# Patient Record
Sex: Female | Born: 1980
Health system: Southern US, Community
[De-identification: ages and names within clinical notes are randomized; demographics above are authoritative.]

## PROBLEM LIST (undated history)

## (undated) DIAGNOSIS — R000 Tachycardia, unspecified: Secondary | ICD-10-CM

## (undated) DIAGNOSIS — D649 Anemia, unspecified: Secondary | ICD-10-CM

## (undated) DIAGNOSIS — M419 Scoliosis, unspecified: Secondary | ICD-10-CM

## (undated) HISTORY — PX: OTHER SURGICAL HISTORY: SHX169

## (undated) HISTORY — DX: Tachycardia, unspecified: R00.0

## (undated) HISTORY — PX: FRACTURE SURGERY: SHX138

## (undated) HISTORY — DX: Scoliosis, unspecified: M41.9

## (undated) HISTORY — DX: Anemia, unspecified: D64.9

---

## 2003-06-30 ENCOUNTER — Emergency Department (HOSPITAL_COMMUNITY): Admission: EM | Admit: 2003-06-30 | Discharge: 2003-07-01 | Payer: Self-pay | Admitting: Emergency Medicine

## 2012-08-18 ENCOUNTER — Ambulatory Visit (INDEPENDENT_AMBULATORY_CARE_PROVIDER_SITE_OTHER): Payer: 59 | Admitting: Internal Medicine

## 2012-08-18 VITALS — BP 132/70 | HR 92 | Temp 98.6°F | Resp 16 | Ht 69.5 in | Wt 148.2 lb

## 2012-08-18 DIAGNOSIS — Z0000 Encounter for general adult medical examination without abnormal findings: Secondary | ICD-10-CM

## 2012-08-18 DIAGNOSIS — Z309 Encounter for contraceptive management, unspecified: Secondary | ICD-10-CM

## 2012-08-18 MED ORDER — NORGESTIM-ETH ESTRAD TRIPHASIC 0.18/0.215/0.25 MG-35 MCG PO TABS: 1.0000 | ORAL_TABLET | Freq: Every day | ORAL | Status: DC

## 2012-08-18 NOTE — Progress Notes (Signed)
  Subjective:    Patient ID: Samantha Friedman, female    DOB: 01-15-1980, 32 y.o.   MRN: [id number]  HPI here for annual exam/first visit at [facility] Healthy general Contraception-Tri[place] Immunizations up to date Family history negative for inheritable diseases or cancers Steady job as a Holiday representative No steady relationship and not sexually active since last Pap smear 1 year ago which is not available to us-does not wish to be rescreened for STDs because of this    Review of Systems  Constitutional: Negative for activity change, appetite change, fatigue and unexpected weight change.  HENT: Negative for ear pain, trouble swallowing and neck pain.   Eyes: Negative for photophobia and visual disturbance.  Respiratory: Negative for cough and shortness of breath.   Cardiovascular: Negative for chest pain, palpitations and leg swelling.  Gastrointestinal: Negative for nausea, abdominal pain and diarrhea.  Endocrine: Negative for cold intolerance and heat intolerance.  Genitourinary: Negative for dysuria, frequency, flank pain, menstrual problem and pelvic pain.  Allergic/Immunologic: Negative for environmental allergies and immunocompromised state.  Neurological: Negative for headaches.  Hematological: Negative for adenopathy. Does not bruise/bleed easily.  Psychiatric/Behavioral: Negative for behavioral problems, sleep disturbance and dysphoric mood.       Objective:   Physical Exam  Constitutional: She is oriented to person, place, and time. She appears well-developed and well-nourished.  HENT:  Right Ear: External ear normal.  Left Ear: External ear normal.  Nose: Nose normal.  Mouth/Throat: Oropharynx is clear and moist.  Eyes: Conjunctivae and EOM are normal. Pupils are equal, round, and reactive to light.  Neck: Normal range of motion. Neck supple. No thyromegaly present.  Cardiovascular: Normal rate, regular rhythm, normal heart sounds and intact distal pulses.   No murmur  heard. Pulmonary/Chest: Breath sounds normal. She has no wheezes.  Abdominal: Soft. Bowel sounds are normal. She exhibits no distension and no mass. There is no tenderness. There is no rebound and no guarding.  Genitourinary: Vagina normal and uterus normal. No vaginal discharge found.  os clear No adnexal masses  Musculoskeletal: Normal range of motion. She exhibits no edema.  Mild scoliosis  Lymphadenopathy:    She has no cervical adenopathy.  Neurological: She is alert and oriented to person, place, and time. She has normal reflexes. No cranial nerve deficit. Coordination normal.  Skin: No rash noted.  Psychiatric: She has a normal mood and affect. Her behavior is normal. Judgment and thought content normal.          Assessment & Plan:  Healthy exam Contraceptive care  Refill Imelda Pillow one year

## 2012-08-21 ENCOUNTER — Encounter: Payer: Self-pay | Admitting: Internal Medicine

## 2012-08-21 LAB — PAP IG (IMAGE GUIDED)

## 2013-07-13 ENCOUNTER — Other Ambulatory Visit: Payer: Self-pay | Admitting: Internal Medicine

## 2013-08-09 ENCOUNTER — Other Ambulatory Visit: Payer: Self-pay | Admitting: Physician Assistant

## 2013-09-09 ENCOUNTER — Other Ambulatory Visit: Payer: Self-pay | Admitting: Physician Assistant

## 2013-09-10 NOTE — Telephone Encounter (Signed)
Pt called back and stated that she will come in to be seen this Saturday. I advised I will send in 1 more pk to cover her until then.

## 2013-09-10 NOTE — Telephone Encounter (Signed)
Pt needs OV, note put on last RF. LMOM for pt to CB and let us know her f/up plan.

## 2013-09-29 ENCOUNTER — Ambulatory Visit (INDEPENDENT_AMBULATORY_CARE_PROVIDER_SITE_OTHER): Payer: 59 | Admitting: Internal Medicine

## 2013-09-29 VITALS — BP 120/80 | HR 75 | Temp 98.0°F | Resp 20 | Ht 70.0 in | Wt 150.5 lb

## 2013-09-29 DIAGNOSIS — Z124 Encounter for screening for malignant neoplasm of cervix: Secondary | ICD-10-CM

## 2013-09-29 DIAGNOSIS — Z0000 Encounter for general adult medical examination without abnormal findings: Secondary | ICD-10-CM

## 2013-09-29 MED ORDER — NORGESTIM-ETH ESTRAD TRIPHASIC 0.18/0.215/0.25 MG-35 MCG PO TABS: 1.0000 | ORAL_TABLET | Freq: Every day | ORAL | Status: DC

## 2013-09-29 NOTE — Progress Notes (Signed)
Subjective:  This chart was scribed for Samantha Sia, MD by Jarvis Morgan, Medial Scribe. This patient was seen in room 14 and the patient's care was started at 12:56 PM.  Chief Complaint  Patient presents with  . Gynecologic Exam     Patient ID: Samantha Friedman, female    DOB: Feb 20, 1980, 33 y.o.   MRN: [id number]  HPI HPI Comments: Samantha Friedman is a 33 y.o. female who presents to the [facility] for her annual gynecological exam. Pt takes Imelda Pillow [facility] pills and has reported no problems with the medication. She denies any new sexual partners or new relationships. Pt reports that she is healthy and has had not medical problems. She denies any urinary issues or weight change in the past year. Pt states she is still exercising but not as much as in the past.  There are no active problems to display for this patient.  Past Medical History  Diagnosis Date  . Scoliosis    Past Surgical History  Procedure Laterality Date  . Fracture surgery     No Known Allergies Prior to Admission medications   Medication Sig Start Date End Date Taking? Authorizing Provider  Norgestimate-Ethinyl Estradiol Triphasic (TRINESSA, 28,) 0.18/0.215/0.25 MG-35 MCG tablet Take 1 tablet by mouth daily. PATIENT NEEDS OFFICE VISIT FOR ADDITIONAL REFILLS 09/10/13   Tonye Pearson, MD   History   Social History  . Marital Status: Single    Spouse Name: N/A    Number of Children: N/A  . Years of Education: N/A   Occupational History  . Not on file.   Social History Main Topics  . Smoking status: Never Smoker   . Smokeless tobacco: Never Used  . Alcohol Use: No  . Drug Use: No  . Sexual Activity: No   Other Topics Concern  . Not on file   Social History Narrative  . No narrative on file    Review of Systems A complete 10 system review of systems was obtained and all systems are negative except as noted in the HPI and PMH.      Objective:   Physical Exam  Nursing  note and vitals reviewed. Constitutional: She is oriented to person, place, and time. She appears well-developed and well-nourished. No distress.  HENT:  Head: Normocephalic and atraumatic.  Eyes: EOM are normal.  Neck: Trachea normal and normal range of motion. Neck supple. No mass and no thyromegaly present.  Cardiovascular: Normal rate, normal heart sounds, intact distal pulses and normal pulses.  Exam reveals no gallop and no friction rub.   No murmur heard. Pulmonary/Chest: Effort normal. No respiratory distress.  Abdominal: Soft. Normal appearance and bowel sounds are normal. She exhibits no distension and no mass. There is no hepatosplenomegaly. There is no tenderness. There is no rebound.  Genitourinary: Rectum normal, vagina normal and uterus normal. No breast swelling, tenderness or discharge.  Musculoskeletal: Normal range of motion.  Lymphadenopathy:    She has no cervical adenopathy.  Neurological: She is alert and oriented to person, place, and time.  Skin: Skin is warm and dry.  Psychiatric: She has a normal mood and affect. Her behavior is normal.     Filed Vitals:   09/29/13 1144  BP: 120/80  Pulse: 75  Temp: 98 F (36.7 C)  TempSrc: Oral  Resp: 20  Height:  (1.778 m)  Weight: 150 lb 8 oz (68.266 kg)  SpO2: 100%         Assessment &  Plan:  Routine general medical examination at a health care facility  Screening for cervical cancer - Plan: Pap IG and HPV (high risk) DNA detection, CANCELED: Pap IG and HPV (high risk) DNA detection  Meds ordered this encounter  Medications  . Norgestimate-Ethinyl Estradiol Triphasic (TRINESSA, 28,) 0.18/0.215/0.25 MG-35 MCG tablet    Sig: Take 1 tablet by mouth daily.    Dispense:  28 tablet    Refill:  11     I personally performed the services described in this documentation, which was scribed in my presence. The recorded information has been reviewed and is accurate.

## 2013-10-01 LAB — PAP IG AND HPV HIGH-RISK: HPV DNA HIGH RISK: NOT DETECTED

## 2013-10-07 ENCOUNTER — Encounter: Payer: Self-pay | Admitting: Internal Medicine

## 2014-08-27 ENCOUNTER — Other Ambulatory Visit: Payer: Self-pay | Admitting: Internal Medicine

## 2014-09-23 ENCOUNTER — Other Ambulatory Visit: Payer: Self-pay | Admitting: Physician Assistant

## 2014-11-07 ENCOUNTER — Other Ambulatory Visit: Payer: Self-pay | Admitting: Internal Medicine

## 2014-11-11 NOTE — Telephone Encounter (Signed)
LM needs to schedule an appointment for further refills

## 2014-12-05 ENCOUNTER — Other Ambulatory Visit: Payer: Self-pay | Admitting: Internal Medicine

## 2014-12-06 ENCOUNTER — Ambulatory Visit (INDEPENDENT_AMBULATORY_CARE_PROVIDER_SITE_OTHER): Payer: 59 | Admitting: Family Medicine

## 2014-12-06 VITALS — BP 122/85 | HR 81 | Temp 98.3°F | Resp 18 | Ht 70.0 in | Wt 147.4 lb

## 2014-12-06 DIAGNOSIS — Z0000 Encounter for general adult medical examination without abnormal findings: Secondary | ICD-10-CM | POA: Diagnosis not present

## 2014-12-06 DIAGNOSIS — Z23 Encounter for immunization: Secondary | ICD-10-CM | POA: Diagnosis not present

## 2014-12-06 DIAGNOSIS — Z3041 Encounter for surveillance of contraceptive pills: Secondary | ICD-10-CM | POA: Diagnosis not present

## 2014-12-06 MED ORDER — NORGESTIM-ETH ESTRAD TRIPHASIC 0.18/0.215/0.25 MG-35 MCG PO TABS: ORAL_TABLET | ORAL | Status: DC

## 2014-12-06 NOTE — Progress Notes (Signed)
[facility] [address], [place] [id number] [phone]  Date:  12/06/2014   Name:  Samantha GrosShanika Hritz   DOB:  02/25/1980   MRN:  [id number]  PCP:  No PCP Per Patient    Chief Complaint: Annual Exam   History of Present Illness:  Samantha Friedman is a 34 y.o. very pleasant female patient who presents with the following:  Generally healthy young woman here today for a CPE-  Negative pap with HPV last year No history of abnl pap She is on OCP- she is happy with this method She is not sure about her last tetanus, flu shots are done at work  She is not fasting todya  There are no active problems to display for this patient.   Past Medical History  Diagnosis Date  . Scoliosis   . Anemia     Past Surgical History  Procedure Laterality Date  . Fracture surgery    . Broken thumb       Social History  Substance Use Topics  . Smoking status: Never Smoker   . Smokeless tobacco: Never Used  . Alcohol Use: No    Family History  Problem Relation Age of Onset  . Diabetes Father   . Cancer Maternal Grandfather     No Known Allergies  Medication list has been reviewed and updated.  Current Outpatient Prescriptions on File Prior to Visit  Medication Sig Dispense Refill  . Norgestimate-Ethinyl Estradiol Triphasic (TRINESSA, 28,) 0.18/0.215/0.25 MG-35 MCG tablet TAKE 1 TABLET BY MOUTH DALY  "OFFICE VISIT NEEDED FOR REFILLS" 28 tablet 0   No current facility-administered medications on file prior to visit.    Review of Systems:  As per HPI- otherwise negative. 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 They do annual labs for her at the health fair there   Physical Examination: Filed Vitals:   12/06/14 1359  BP: 130/80  Pulse: 81  Temp: 98.3 F (36.8 C)  Resp: 18   Filed Vitals:   12/06/14 1359  Height: 5\' 10"  (1.778 m)  Weight: 147 lb 6.4 oz (66.86 kg)   Body mass index is 21.15 kg/(m^2). Ideal Body Weight: Weight in (lb) to have BMI =  25: 173.9  GEN: WDWN, NAD, Non-toxic, A & O x 3, slim, looks well HEENT: Atraumatic, Normocephalic. Neck supple. No masses, No LAD. Ears and Nose: No external deformity. CV: RRR, No M/G/R. No JVD. No thrill. No extra heart sounds. PULM: CTA B, no wheezes, crackles, rhonchi. No retractions. No resp. distress. No accessory muscle use. ABD: S, NT, ND, +BS. No rebound. No HSM. EXTR: No c/c/e NEURO Normal gait.  PSYCH: Normally interactive. Conversant. Not depressed or anxious appearing.  Calm demeanor.    Assessment and Plan: Physical exam  Oral contraceptive pill surveillance  Immunization due - Plan: Tdap vaccine greater than or equal to 7yo IM  Here today for a CPE. Does not need a pap, flu shot and labs done at work.  She declines STI screening as she is not sexually active Refilled her OCP for a year  Signed Abbe AmsterdamJessica Chanay Nugent, MD

## 2014-12-06 NOTE — Patient Instructions (Signed)
It was good to see you today- you got your tetanus shot today, this is good for 10 years.   I also refilled your birth control pill Let Koreaus know if you need anything and have a happy holiday season

## 2015-10-26 ENCOUNTER — Other Ambulatory Visit: Payer: Self-pay | Admitting: Family Medicine

## 2015-10-26 ENCOUNTER — Other Ambulatory Visit: Payer: Self-pay | Admitting: Emergency Medicine

## 2015-10-26 MED ORDER — NORGESTIM-ETH ESTRAD TRIPHASIC 0.18/0.215/0.25 MG-35 MCG PO TABS: ORAL_TABLET | ORAL | 0 refills | Status: DC

## 2016-01-04 DIAGNOSIS — I82409 Acute embolism and thrombosis of unspecified deep veins of unspecified lower extremity: Secondary | ICD-10-CM

## 2016-01-04 DIAGNOSIS — I2699 Other pulmonary embolism without acute cor pulmonale: Secondary | ICD-10-CM

## 2016-01-04 HISTORY — DX: Other pulmonary embolism without acute cor pulmonale: I26.99

## 2016-01-04 HISTORY — DX: Acute embolism and thrombosis of unspecified deep veins of unspecified lower extremity: I82.409

## 2016-01-11 ENCOUNTER — Emergency Department (HOSPITAL_COMMUNITY): Payer: 59

## 2016-01-11 ENCOUNTER — Encounter (HOSPITAL_COMMUNITY): Payer: Self-pay | Admitting: Emergency Medicine

## 2016-01-11 DIAGNOSIS — Y999 Unspecified external cause status: Secondary | ICD-10-CM | POA: Insufficient documentation

## 2016-01-11 DIAGNOSIS — S96912A Strain of unspecified muscle and tendon at ankle and foot level, left foot, initial encounter: Secondary | ICD-10-CM | POA: Diagnosis not present

## 2016-01-11 DIAGNOSIS — X509XXA Other and unspecified overexertion or strenuous movements or postures, initial encounter: Secondary | ICD-10-CM | POA: Diagnosis not present

## 2016-01-11 DIAGNOSIS — Y929 Unspecified place or not applicable: Secondary | ICD-10-CM | POA: Diagnosis not present

## 2016-01-11 DIAGNOSIS — S99912A Unspecified injury of left ankle, initial encounter: Secondary | ICD-10-CM | POA: Diagnosis present

## 2016-01-11 DIAGNOSIS — Y9367 Activity, basketball: Secondary | ICD-10-CM | POA: Insufficient documentation

## 2016-01-11 NOTE — ED Triage Notes (Signed)
Patient injured her left ankle while playing basketball this evening , presents with left ankle pain /mild swelling .

## 2016-01-12 ENCOUNTER — Emergency Department (HOSPITAL_COMMUNITY)
Admission: EM | Admit: 2016-01-12 | Discharge: 2016-01-12 | Disposition: A | Discharge status: Home / Self Care | Payer: 59 | Attending: Emergency Medicine | Admitting: Emergency Medicine

## 2016-01-12 DIAGNOSIS — S86012A Strain of left Achilles tendon, initial encounter: Secondary | ICD-10-CM

## 2016-01-12 MED ORDER — OXYCODONE-ACETAMINOPHEN 5-325 MG PO TABS
1.0000 | ORAL_TABLET | ORAL | 0 refills | Status: DC | PRN
Start: 2016-01-12 — End: 2016-01-14

## 2016-01-12 MED ORDER — IBUPROFEN 800 MG PO TABS
800.0000 mg | ORAL_TABLET | Freq: Once | ORAL | Status: AC
  Administered 2016-01-12: 800 mg via ORAL
  Filled 2016-01-12: qty 1

## 2016-01-12 NOTE — Progress Notes (Signed)
Orthopedic Tech Progress Note Patient Details:  Rosendo GrosShanika Tayag 05/27/1980 [id number]  Ortho Devices Type of Ortho Device: Crutches, Post (short leg) splint Ortho Device/Splint Location: lle Ortho Device/Splint Interventions: Ordered, Application   Trinna PostMartinez, Alfonso Carden J 01/12/2016, 7:13 AM

## 2016-01-12 NOTE — ED Notes (Signed)
Ortho tech places splint on L ankle and pt was given crutches. She got education from [employer] tech as well, on crutches.

## 2016-01-12 NOTE — Discharge Instructions (Signed)
REMAIN NON-WEIGHT BEARING ON THE LEFT LEG. MAKE AN APPOINTMENT WITH DR. BLACKMAN AS SOON AS POSSIBLE FOR FURTHER EVALUATION AND MANAGEMENT OF LEFT ANKLE INJURY, SUSPECTED TO BE A PARTIAL ACHILLES TEAR.

## 2016-01-13 ENCOUNTER — Encounter (HOSPITAL_COMMUNITY): Payer: Self-pay | Admitting: *Deleted

## 2016-01-13 ENCOUNTER — Other Ambulatory Visit (INDEPENDENT_AMBULATORY_CARE_PROVIDER_SITE_OTHER): Payer: Self-pay | Admitting: Physician Assistant

## 2016-01-13 ENCOUNTER — Ambulatory Visit (INDEPENDENT_AMBULATORY_CARE_PROVIDER_SITE_OTHER): Payer: 59 | Admitting: Orthopaedic Surgery

## 2016-01-13 ENCOUNTER — Encounter (INDEPENDENT_AMBULATORY_CARE_PROVIDER_SITE_OTHER): Payer: Self-pay | Admitting: Orthopaedic Surgery

## 2016-01-13 DIAGNOSIS — S86012A Strain of left Achilles tendon, initial encounter: Secondary | ICD-10-CM

## 2016-01-13 NOTE — Progress Notes (Signed)
   Office Visit Note   Patient: Samantha GrosShanika Gergely           Date of Birth: 05/02/1980           MRN: [id number] Visit Date: 01/13/2016              Requested by: No referring provider defined for this encounter. PCP: No PCP Per Patient   Assessment & Plan: Visit Diagnoses:  1. Rupture of left Achilles tendon, initial encounter     Plan: Given that this is a complete rupture of her left Achilles tendon we are recommending an open direct primary repair of the Achilles. I explained in detail what surgery involves including a thorough discussion of the risks and benefits of surgery. We'll put her in a walking boot today with a 2 heel buildup. We'll plan on surgery tomorrow. We would then see her back in 2 weeks postoperative and she'll bring her boot with her that visit.  Follow-Up Instructions: Return for 2 weeks post-op.   Orders:  No orders of the defined types were placed in this encounter.  No orders of the defined types were placed in this encounter.     Procedures: No procedures performed   Clinical Data: No additional findings.   Subjective: Chief Complaint  Patient presents with  . Left Ankle - Follow-up    HPI The patient is a very pleasant 36 year old female who works for [employer]. She is playing basketball on Monday evening when she felt a pop in his running. The pop was in her left ankle and she had a ground. She is seen in emergency room and felt she had either fracture a severe sprain or an Achilles tendon rupture. She is placed in a splint and given follow-up our office. She does report posterior left ankle pain. She is injured this ankle in the past terms of sprains the last was about a year ago. Review of Systems He denies any numbness in her left foot. She also denies any chest pain, headache, sugars breath, fever, chills, nausea, vomiting.  Objective: Vital Signs: LMP 12/30/2015 (Approximate)   Physical Exam His alert and oriented 3 in no acute  distress. She family with the crutches. Ortho Exam On examination of her left ankle we took her out of her splint. She has posterior swelling. She has a positive Thompson test with deficit in her Achilles tendon. He can palpate the deficit. She is otherwise neurovascular intact is a stable ankle exam.   Specialty Comments:  No specialty comments available.  Imaging: No results found.   PMFS History: There are no active problems to display for this patient.  Past Medical History:  Diagnosis Date  . Anemia   . Scoliosis     Family History  Problem Relation Age of Onset  . Diabetes Father   . Cancer Maternal Grandfather     Past Surgical History:  Procedure Laterality Date  . broken thumb     . FRACTURE SURGERY     Social History   Occupational History  . Not on file.   Social History Main Topics  . Smoking status: Never Smoker  . Smokeless tobacco: Never Used  . Alcohol use No  . Drug use: No  . Sexual activity: Yes

## 2016-01-13 NOTE — Progress Notes (Signed)
Please place orders in EPIC as patient will be a Same Day Admit (to sign consent and do lab work am of surgery) patient for Short Stay in Am! Thank you!

## 2016-01-14 ENCOUNTER — Encounter (HOSPITAL_COMMUNITY)
Admission: RE | Disposition: A | Discharge status: Home / Self Care | Payer: Self-pay | Source: Ambulatory Visit | Attending: Orthopaedic Surgery

## 2016-01-14 ENCOUNTER — Encounter (HOSPITAL_COMMUNITY): Payer: Self-pay | Admitting: *Deleted

## 2016-01-14 ENCOUNTER — Ambulatory Visit (HOSPITAL_COMMUNITY): Payer: 59 | Admitting: Anesthesiology

## 2016-01-14 ENCOUNTER — Ambulatory Visit (HOSPITAL_COMMUNITY)
Admission: RE | Admit: 2016-01-14 | Discharge: 2016-01-14 | Disposition: A | Discharge status: Home / Self Care | Payer: 59 | Source: Ambulatory Visit | Attending: Orthopaedic Surgery | Admitting: Orthopaedic Surgery

## 2016-01-14 DIAGNOSIS — S86012A Strain of left Achilles tendon, initial encounter: Secondary | ICD-10-CM | POA: Diagnosis present

## 2016-01-14 DIAGNOSIS — Z793 Long term (current) use of hormonal contraceptives: Secondary | ICD-10-CM | POA: Diagnosis not present

## 2016-01-14 DIAGNOSIS — M66362 Spontaneous rupture of flexor tendons, left lower leg: Secondary | ICD-10-CM | POA: Diagnosis not present

## 2016-01-14 DIAGNOSIS — X58XXXA Exposure to other specified factors, initial encounter: Secondary | ICD-10-CM | POA: Insufficient documentation

## 2016-01-14 HISTORY — PX: ACHILLES TENDON SURGERY: SHX542

## 2016-01-14 LAB — CBC
HCT: 35.4 % — ABNORMAL LOW (ref 36.0–46.0)
Hemoglobin: 12.1 g/dL (ref 12.0–15.0)
MCH: 32.6 pg (ref 26.0–34.0)
MCHC: 34.2 g/dL (ref 30.0–36.0)
MCV: 95.4 fL (ref 78.0–100.0)
PLATELETS: 231 10*3/uL (ref 150–400)
RBC: 3.71 MIL/uL — ABNORMAL LOW (ref 3.87–5.11)
RDW: 13 % (ref 11.5–15.5)
WBC: 9.8 10*3/uL (ref 4.0–10.5)

## 2016-01-14 LAB — PREGNANCY, URINE: PREG TEST UR: NEGATIVE

## 2016-01-14 SURGERY — REPAIR, TENDON, ACHILLES
Anesthesia: General | Laterality: Left

## 2016-01-14 MED ORDER — FENTANYL CITRATE (PF) 100 MCG/2ML IJ SOLN
50.0000 ug | Freq: Once | INTRAMUSCULAR | Status: AC
Start: 2016-01-14 — End: 2016-01-14
  Administered 2016-01-14: 50 ug via INTRAVENOUS

## 2016-01-14 MED ORDER — BUPIVACAINE HCL (PF) 0.25 % IJ SOLN
INTRAMUSCULAR | Status: DC | PRN
  Administered 2016-01-14: 20 mL

## 2016-01-14 MED ORDER — DEXAMETHASONE SODIUM PHOSPHATE 10 MG/ML IJ SOLN
INTRAMUSCULAR | Status: DC | PRN
  Administered 2016-01-14: 10 mg via INTRAVENOUS

## 2016-01-14 MED ORDER — OXYCODONE-ACETAMINOPHEN 5-325 MG PO TABS: 1.0000 | ORAL_TABLET | ORAL | 0 refills | Status: DC | PRN

## 2016-01-14 MED ORDER — MIDAZOLAM HCL 2 MG/2ML IJ SOLN
INTRAMUSCULAR | Status: AC
  Filled 2016-01-14: qty 2

## 2016-01-14 MED ORDER — EPINEPHRINE PF 1 MG/ML IJ SOLN
INTRAMUSCULAR | Status: AC
  Filled 2016-01-14: qty 1

## 2016-01-14 MED ORDER — ONDANSETRON HCL 4 MG/2ML IJ SOLN
INTRAMUSCULAR | Status: DC | PRN
  Administered 2016-01-14: 4 mg via INTRAVENOUS

## 2016-01-14 MED ORDER — PROPOFOL 10 MG/ML IV BOLUS
INTRAVENOUS | Status: AC
  Filled 2016-01-14: qty 20

## 2016-01-14 MED ORDER — LACTATED RINGERS IV SOLN
INTRAVENOUS | Status: DC
Start: 2016-01-14 — End: 2016-01-14
  Administered 2016-01-14: 09:00:00 via INTRAVENOUS

## 2016-01-14 MED ORDER — PROPOFOL 10 MG/ML IV BOLUS
INTRAVENOUS | Status: DC | PRN
  Administered 2016-01-14: 150 mg via INTRAVENOUS
  Administered 2016-01-14: 50 mg via INTRAVENOUS

## 2016-01-14 MED ORDER — ESMOLOL HCL 100 MG/10ML IV SOLN
INTRAVENOUS | Status: DC | PRN
  Administered 2016-01-14 (×2): 20 mg via INTRAVENOUS

## 2016-01-14 MED ORDER — DEXAMETHASONE SODIUM PHOSPHATE 10 MG/ML IJ SOLN
INTRAMUSCULAR | Status: AC
  Filled 2016-01-14: qty 1

## 2016-01-14 MED ORDER — CEFAZOLIN SODIUM-DEXTROSE 2-4 GM/100ML-% IV SOLN
2.0000 g | INTRAVENOUS | Status: AC
  Administered 2016-01-14: 2 g via INTRAVENOUS
  Filled 2016-01-14: qty 100

## 2016-01-14 MED ORDER — CEFAZOLIN SODIUM-DEXTROSE 2-4 GM/100ML-% IV SOLN
INTRAVENOUS | Status: AC
  Filled 2016-01-14: qty 100

## 2016-01-14 MED ORDER — FENTANYL CITRATE (PF) 100 MCG/2ML IJ SOLN
INTRAMUSCULAR | Status: DC | PRN
  Administered 2016-01-14: 50 ug via INTRAVENOUS

## 2016-01-14 MED ORDER — CHLORHEXIDINE GLUCONATE 4 % EX LIQD: 60.0000 mL | Freq: Once | CUTANEOUS | Status: DC

## 2016-01-14 MED ORDER — MIDAZOLAM HCL 2 MG/2ML IJ SOLN
2.0000 mg | Freq: Once | INTRAMUSCULAR | Status: AC
  Administered 2016-01-14: 2 mg via INTRAVENOUS

## 2016-01-14 MED ORDER — FENTANYL CITRATE (PF) 100 MCG/2ML IJ SOLN
INTRAMUSCULAR | Status: AC
  Filled 2016-01-14: qty 2

## 2016-01-14 MED ORDER — LIDOCAINE HCL (CARDIAC) 20 MG/ML IV SOLN
INTRAVENOUS | Status: DC | PRN
Start: 2016-01-14 — End: 2016-01-14
  Administered 2016-01-14: 60 mg via INTRAVENOUS

## 2016-01-14 MED ORDER — ONDANSETRON HCL 4 MG/2ML IJ SOLN
INTRAMUSCULAR | Status: AC
  Filled 2016-01-14: qty 2

## 2016-01-14 MED ORDER — BUPIVACAINE HCL (PF) 0.5 % IJ SOLN
INTRAMUSCULAR | Status: AC
  Filled 2016-01-14: qty 30

## 2016-01-14 MED ORDER — BUPIVACAINE HCL (PF) 0.25 % IJ SOLN
INTRAMUSCULAR | Status: AC
  Filled 2016-01-14: qty 30

## 2016-01-14 MED ORDER — ESMOLOL HCL 100 MG/10ML IV SOLN
INTRAVENOUS | Status: AC
  Filled 2016-01-14: qty 10

## 2016-01-14 MED ORDER — ROCURONIUM BROMIDE 50 MG/5ML IV SOSY
PREFILLED_SYRINGE | INTRAVENOUS | Status: AC
  Filled 2016-01-14: qty 5

## 2016-01-14 MED ORDER — BUPIVACAINE-EPINEPHRINE (PF) 0.5% -1:200000 IJ SOLN
INTRAMUSCULAR | Status: DC | PRN
  Administered 2016-01-14: 30 mL via PERINEURAL

## 2016-01-14 MED ORDER — SUGAMMADEX SODIUM 200 MG/2ML IV SOLN
INTRAVENOUS | Status: DC | PRN
  Administered 2016-01-14: 200 mg via INTRAVENOUS

## 2016-01-14 MED ORDER — ROCURONIUM BROMIDE 100 MG/10ML IV SOLN
INTRAVENOUS | Status: DC | PRN
  Administered 2016-01-14: 50 mg via INTRAVENOUS

## 2016-01-14 MED ORDER — LIDOCAINE 2% (20 MG/ML) 5 ML SYRINGE
INTRAMUSCULAR | Status: AC
  Filled 2016-01-14: qty 5

## 2016-01-14 SURGICAL SUPPLY — 49 items
BAG ZIPLOCK 12X15 (MISCELLANEOUS) IMPLANT
BANDAGE ACE 4X5 VEL STRL LF (GAUZE/BANDAGES/DRESSINGS) ×6 IMPLANT
BANDAGE ACE 6X5 VEL STRL LF (GAUZE/BANDAGES/DRESSINGS) ×3 IMPLANT
BANDAGE ESMARK 6X9 LF (GAUZE/BANDAGES/DRESSINGS) ×1 IMPLANT
BNDG ESMARK 6X9 LF (GAUZE/BANDAGES/DRESSINGS) ×3
CUFF TOURN SGL QUICK 34 (TOURNIQUET CUFF) ×2
CUFF TRNQT CYL 34X4X40X1 (TOURNIQUET CUFF) ×1 IMPLANT
DRAPE U-SHAPE 47X51 STRL (DRAPES) ×3 IMPLANT
DRSG KUZMA FLUFF (GAUZE/BANDAGES/DRESSINGS) ×6 IMPLANT
DRSG PAD ABDOMINAL 8X10 ST (GAUZE/BANDAGES/DRESSINGS) ×3 IMPLANT
DURAPREP 26ML APPLICATOR (WOUND CARE) ×3 IMPLANT
ELECT REM PT RETURN 9FT ADLT (ELECTROSURGICAL) ×3
ELECTRODE REM PT RTRN 9FT ADLT (ELECTROSURGICAL) ×1 IMPLANT
GAUZE SPONGE 4X4 12PLY STRL (GAUZE/BANDAGES/DRESSINGS) ×3 IMPLANT
GAUZE XEROFORM 1X8 LF (GAUZE/BANDAGES/DRESSINGS) ×3 IMPLANT
GLOVE BIO SURGEON STRL SZ7.5 (GLOVE) ×3 IMPLANT
GLOVE BIOGEL PI IND STRL 8 (GLOVE) ×3 IMPLANT
GLOVE BIOGEL PI IND STRL 8.5 (GLOVE) ×1 IMPLANT
GLOVE BIOGEL PI INDICATOR 8 (GLOVE) ×6
GLOVE BIOGEL PI INDICATOR 8.5 (GLOVE) ×2
GLOVE ECLIPSE 8.0 STRL XLNG CF (GLOVE) ×3 IMPLANT
GLOVE SURG SS PI 7.5 STRL IVOR (GLOVE) ×3 IMPLANT
GOWN STRL REUS W/TWL XL LVL3 (GOWN DISPOSABLE) ×6 IMPLANT
MANIFOLD NEPTUNE II (INSTRUMENTS) ×3 IMPLANT
NEEDLE HYPO 22GX1.5 SAFETY (NEEDLE) ×6 IMPLANT
NEEDLE MAYO CATGUT SZ4 (NEEDLE) IMPLANT
PACK ORTHO EXTREMITY (CUSTOM PROCEDURE TRAY) ×3 IMPLANT
PAD ABD 8X10 STRL (GAUZE/BANDAGES/DRESSINGS) ×3 IMPLANT
PAD CAST 4YDX4 CTTN HI CHSV (CAST SUPPLIES) ×2 IMPLANT
PADDING CAST COTTON 4X4 STRL (CAST SUPPLIES) ×4
PADDING CAST COTTON 6X4 STRL (CAST SUPPLIES) ×3 IMPLANT
POSITIONER SURGICAL ARM (MISCELLANEOUS) ×3 IMPLANT
SPLINT FIBERGLASS 5X30 (CAST SUPPLIES) ×3 IMPLANT
SPLINT PLASTER CAST XFAST 5X30 (CAST SUPPLIES) ×1 IMPLANT
SPLINT PLASTER XFAST SET 5X30 (CAST SUPPLIES) ×2
SPONGE LAP 4X18 X RAY DECT (DISPOSABLE) ×6 IMPLANT
STOCKINETTE 6  STRL (DRAPES) ×2
STOCKINETTE 6 STRL (DRAPES) ×1 IMPLANT
SUT ETHIBOND NAB CT1 #1 30IN (SUTURE) IMPLANT
SUT ETHILON 2 0 PSLX (SUTURE) ×6 IMPLANT
SUT FIBERWIRE #2 38 T-5 BLUE (SUTURE) ×6
SUT NYLON 3 0 (SUTURE) ×6 IMPLANT
SUT VIC AB 0 CT1 36 (SUTURE) ×3 IMPLANT
SUT VIC AB 1 CT1 36 (SUTURE) IMPLANT
SUT VIC AB 2-0 CT1 27 (SUTURE) ×2
SUT VIC AB 2-0 CT1 TAPERPNT 27 (SUTURE) ×1 IMPLANT
SUTURE FIBERWR #2 38 T-5 BLUE (SUTURE) ×2 IMPLANT
SYR 20CC LL (SYRINGE) ×3 IMPLANT
TOWEL OR 17X26 10 PK STRL BLUE (TOWEL DISPOSABLE) ×3 IMPLANT

## 2016-01-14 NOTE — Discharge Instructions (Signed)
No weight bearing on your left ankle at all. Ice and elevation for swelling. Bring your walking boot to your follow-up appointment in 2 weeks.

## 2016-01-14 NOTE — Progress Notes (Signed)
Assisted Dr. Hodierne with left, ultrasound guided, popliteal block. Side rails up, monitors on throughout procedure. See vital signs in flow sheet. Tolerated Procedure well. 

## 2016-01-14 NOTE — Anesthesia Preprocedure Evaluation (Addendum)
Anesthesia Evaluation  Patient identified by MRN, date of birth, ID band Patient awake    Reviewed: Allergy & Precautions, H&P , NPO status , Patient's Chart, lab work & pertinent test results  Airway Mallampati: II   Neck ROM: full    Dental   Pulmonary neg pulmonary ROS,    breath sounds clear to auscultation       Cardiovascular negative cardio ROS   Rhythm:regular Rate:Normal     Neuro/Psych    GI/Hepatic   Endo/Other    Renal/GU      Musculoskeletal scoliosis   Abdominal   Peds  Hematology   Anesthesia Other Findings   Reproductive/Obstetrics                             Anesthesia Physical Anesthesia Plan  ASA: I  Anesthesia Plan: General   Post-op Pain Management:  Regional for Post-op pain   Induction: Intravenous  Airway Management Planned: Oral ETT  Additional Equipment:   Intra-op Plan:   Post-operative Plan: Extubation in OR  Informed Consent: I have reviewed the patients History and Physical, chart, labs and discussed the procedure including the risks, benefits and alternatives for the proposed anesthesia with the patient or authorized representative who has indicated his/her understanding and acceptance.     Plan Discussed with: CRNA, Anesthesiologist and Surgeon  Anesthesia Plan Comments:         Anesthesia Quick Evaluation

## 2016-01-14 NOTE — Anesthesia Procedure Notes (Signed)
Anesthesia Regional Block:  Popliteal block  Pre-Anesthetic Checklist: ,, timeout performed, Correct Patient, Correct Site, Correct Laterality, Correct Procedure, Correct Position, site marked, Risks and benefits discussed,  Surgical consent,  Pre-op evaluation,  At surgeon's request and post-op pain management  Laterality: Left  Prep: chloraprep       Needles:  Injection technique: Single-shot  Needle Type: Echogenic Stimulator Needle     Needle Length:cm 9 cm Needle Gauge: 21 G    Additional Needles:  Procedures: ultrasound guided (picture in chart) and nerve stimulator Popliteal block  Nerve Stimulator or Paresthesia:  Response: plantar flexion of foot, 0.45 mA,   Additional Responses:   Narrative:  Start time: 01/14/2016 9:10 AM End time: 01/14/2016 9:15 AM Injection made incrementally with aspirations every 5 mL.  Performed by: Personally  Anesthesiologist: Langston Tuberville  Additional Notes: Functioning IV was confirmed and monitors were applied.  A 90mm 21ga Arrow echogenic stimulator needle was used. Sterile prep and drape,hand hygiene and sterile gloves were used.  Negative aspiration and negative test dose prior to incremental administration of local anesthetic. The patient tolerated the procedure well.  Ultrasound guidance: relevent anatomy identified, needle position confirmed, local anesthetic spread visualized around nerve(s), vascular puncture avoided.  Image printed for medical record.

## 2016-01-14 NOTE — Transfer of Care (Signed)
Immediate Anesthesia Transfer of Care Note  Patient: Samantha Friedman  Procedure(s) Performed: Procedure(s): DIRECT PRIMARY REPAIR LEFT ACHILLES TENDON (Left)  Patient Location: PACU  Anesthesia Type:General  Level of Consciousness: awake, alert  and oriented  Airway & Oxygen Therapy: Patient Spontanous Breathing and Patient connected to face mask oxygen  Post-op Assessment: Report given to RN and Post -op Vital signs reviewed and stable  Post vital signs: Reviewed and stable  Last Vitals:  Vitals:   01/14/16 0940 01/14/16 0945  BP: 126/64 121/88  Pulse: 87 91  Resp: 18 18  Temp:      Last Pain:  Vitals:   01/14/16 0729  TempSrc:   PainSc: 6       Patients Stated Pain Goal: 3 (01/14/16 0729)  Complications: No apparent anesthesia complications

## 2016-01-14 NOTE — H&P (Signed)
Samantha Friedman is an 36 y.o. female.   Chief Complaint:   Left ankle pain; known complete achilles tendon rupture HPI:   36 yo female with a left achilles tendon complete rupture earlier this week that occurred while playing basketball.  We have recommended a direct primary repair.  Past Medical History:  Diagnosis Date  . Anemia   . Scoliosis     Past Surgical History:  Procedure Laterality Date  . broken thumb     . FRACTURE SURGERY      Family History  Problem Relation Age of Onset  . Diabetes Father   . Cancer Maternal Grandfather    Social History:  reports that she has never smoked. She has never used smokeless tobacco. She reports that she does not drink alcohol or use drugs.  Allergies:  Allergies  Allergen Reactions  . Latex Rash    Medications Prior to Admission  Medication Sig Dispense Refill  . Norgestimate-Ethinyl Estradiol Triphasic (TRINESSA, 28,) 0.18/0.215/0.25 MG-35 MCG tablet TAKE 1 TABLET BY MOUTH DALY 3 Package 0  . oxyCODONE-acetaminophen (PERCOCET/ROXICET) 5-325 MG tablet Take 1 tablet by mouth every 4 (four) hours as needed for severe pain. 10 tablet 0  . PREVIDENT 5000 PLUS 1.1 % CREA dental cream Place 1 application onto teeth at bedtime.  6    Results for orders placed or performed during the hospital encounter of 01/14/16 (from the past 48 hour(s))  Pregnancy, urine     Status: None   Collection Time: 01/14/16  7:35 AM  Result Value Ref Range   Preg Test, Ur NEGATIVE NEGATIVE    Comment:        THE SENSITIVITY OF THIS METHODOLOGY IS >20 mIU/mL.   CBC     Status: Abnormal   Collection Time: 01/14/16  7:35 AM  Result Value Ref Range   WBC 9.8 4.0 - 10.5 K/uL   RBC 3.71 (L) 3.87 - 5.11 MIL/uL   Hemoglobin 12.1 12.0 - 15.0 g/dL   HCT 16.1 (L) 09.6 - 04.5 %   MCV 95.4 78.0 - 100.0 fL   MCH 32.6 26.0 - 34.0 pg   MCHC 34.2 30.0 - 36.0 g/dL   RDW 40.9 81.1 - 91.4 %   Platelets 231 150 - 400 K/uL   No results found.  Review of Systems   All other systems reviewed and are negative.   Blood pressure (!) 142/88, pulse 79, temperature 98.1 F (36.7 C), temperature source Oral, resp. rate 11, height 5\' 11"  (1.803 m), weight 158 lb (71.7 kg), last menstrual period 12/30/2015, SpO2 100 %. Physical Exam  Constitutional: She is oriented to person, place, and time. She appears well-developed and well-nourished.  HENT:  Head: Normocephalic and atraumatic.  Eyes: EOM are normal. Pupils are equal, round, and reactive to light.  Neck: Normal range of motion. Neck supple.  Cardiovascular: Normal rate and regular rhythm.   Respiratory: Effort normal and breath sounds normal.  GI: Soft. Bowel sounds are normal.  Musculoskeletal:       Left ankle: She exhibits swelling and ecchymosis. Achilles tendon exhibits pain, defect and abnormal Thompson's test results.  Neurological: She is alert and oriented to person, place, and time.  Skin: Skin is warm and dry.  Psychiatric: She has a normal mood and affect.     Assessment/Plan Acute complete rupture of the left achilles tendon 1)  To the OR today as an outpatient for a direct primary repair of her left achilles tendon. Risks and benefits discussed in  detail.  Kathryne Hitchhristopher Y Clarance Bollard, MD 01/14/2016, 9:10 AM

## 2016-01-14 NOTE — Brief Op Note (Signed)
01/14/2016  10:43 AM  PATIENT:  Rosendo GrosShanika Hubner  36 y.o. female  PRE-OPERATIVE DIAGNOSIS:  left achilles tendon complete rupture  POST-OPERATIVE DIAGNOSIS:  left achilles tendon complete ruptur  PROCEDURE:  Procedure(s): DIRECT PRIMARY REPAIR LEFT ACHILLES TENDON (Left)  SURGEON:  Surgeon(s) and Role:    * Kathryne Hitchhristopher Y Liya Strollo, MD - Primary  PHYSICIAN ASSISTANT: Rexene EdisonGil Clark, PA-C  ANESTHESIA:   local, regional and general  EBL: minimal  LOCAL MEDICATIONS USED:  MARCAINE     COUNTS:  YES  TOURNIQUET:  * Missing tourniquet times found for documented tourniquets in log:  [id number] *  DICTATION: .Other Dictation: Dictation Number [id number]  PLAN OF CARE: Discharge to home after PACU  PATIENT DISPOSITION:  PACU - hemodynamically stable.   Delay start of Pharmacological VTE agent (>24hrs) due to surgical blood loss or risk of bleeding: no

## 2016-01-14 NOTE — Anesthesia Procedure Notes (Signed)
Procedure Name: Intubation Date/Time: 01/14/2016 9:59 AM Performed by: Thornell MuleSTUBBLEFIELD, Hadessah Grennan G Pre-anesthesia Checklist: Patient identified, Emergency Drugs available, Suction available and Patient being monitored Patient Re-evaluated:Patient Re-evaluated prior to inductionOxygen Delivery Method: Circle system utilized Preoxygenation: Pre-oxygenation with 100% oxygen Intubation Type: IV induction Ventilation: Mask ventilation without difficulty Laryngoscope Size: Miller and 3 Grade View: Grade I Tube type: Oral Tube size: 7.0 mm Number of attempts: 1 Airway Equipment and Method: Stylet and Oral airway Placement Confirmation: ETT inserted through vocal cords under direct vision,  positive ETCO2 and breath sounds checked- equal and bilateral Secured at: 20 cm Tube secured with: Tape Dental Injury: Teeth and Oropharynx as per pre-operative assessment

## 2016-01-14 NOTE — Anesthesia Postprocedure Evaluation (Signed)
Anesthesia Post Note  Patient: Samantha GrosShanika Kluever  Procedure(s) Performed: Procedure(s) (LRB): DIRECT PRIMARY REPAIR LEFT ACHILLES TENDON (Left)  Patient location during evaluation: PACU Anesthesia Type: General Level of consciousness: awake and alert and patient cooperative Pain management: pain level controlled Vital Signs Assessment: post-procedure vital signs reviewed and stable Respiratory status: spontaneous breathing and respiratory function stable Cardiovascular status: stable Anesthetic complications: no       Last Vitals:  Vitals:   01/14/16 1149 01/14/16 1159  BP: 121/79 124/85  Pulse: 84   Resp: 16 15  Temp: 36.6 C 36.9 C    Last Pain:  Vitals:   01/14/16 1125  TempSrc: Oral  PainSc:                  Anaiza Behrens S

## 2016-01-15 ENCOUNTER — Telehealth (INDEPENDENT_AMBULATORY_CARE_PROVIDER_SITE_OTHER): Payer: Self-pay | Admitting: Orthopaedic Surgery

## 2016-01-15 ENCOUNTER — Other Ambulatory Visit: Payer: Self-pay | Admitting: Family Medicine

## 2016-01-15 NOTE — Telephone Encounter (Signed)
Hospital told pt that she needs to take splint off but wants to know if she needs to re wrap after taking this off.   Pt number is [phone]

## 2016-01-15 NOTE — Telephone Encounter (Signed)
She is not supposed to take her splint off at all!!  She needs to leave that one on until her follow-up appointment post-op.

## 2016-01-15 NOTE — Telephone Encounter (Signed)
Patient aware of the below message  

## 2016-01-15 NOTE — Op Note (Signed)
NAMEMarland Kitchen  Samantha Friedman, Samantha Friedman             ACCOUNT NO.:  [id number]  MEDICAL RECORD NO.:  [id number]  LOCATION:                                 FACILITY:  PHYSICIAN:  Samantha Panda. Magnus Ivan, M.D.DATE OF BIRTH:  March 03, 1980  DATE OF PROCEDURE:  01/14/2016 DATE OF DISCHARGE:  01/14/2016                              OPERATIVE REPORT   PREOPERATIVE DIAGNOSIS:  Complete rupture of left Achilles tendon.  POSTOPERATIVE DIAGNOSIS:  Complete rupture of left Achilles tendon.  PROCEDURE:  Direct primary repair of ruptured left Achilles tendon.  SURGEON:  Samantha Panda. Magnus Ivan, M.D.  ASSISTANT:  Samantha Canal, PA-C.  ANESTHESIA: 1. Left lower extremity popliteal block. 2. General. 3. Local with 0.25% plain Marcaine.  ANTIBIOTICS:  2 g of IV Ancef.  BLOOD LOSS:  Minimal.  TOURNIQUET TIME:  Less than 1 hour.  COMPLICATIONS:  None.  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  She was seen in the emergency room, placed in a splint and we saw her in the office yesterday.  Her [name] test was positive and we could feel a deficit in the Achilles tendon consistent with a complete rupture.  She is in a regular athlete and does wish to proceed with a direct primary repair.  We have thorough discussion of risks and benefits of surgery including the major risk of infection and wound complications given Achilles surgery.  She understands the goals are improved mobility and function of the Achilles with time.  PROCEDURE DESCRIPTION:  After informed consent was obtained, appropriate left ankle was marked.  Anesthesia obtained a popliteal block.  She was brought to the operating room, general anesthesia was obtained while she was on her stretcher.  She was then rolled in a prone position with appropriate axillary rolls and chest rolls as well as padding of all bony prominences.  A nonsterile  tourniquet was already placed around her upper left thigh and her left foot, leg and ankle as toes were prepped and draped with DuraPrep and sterile drapes.  Time-out was called and she was identified as correct patient and correct left Achilles.  We then used an Esmarch to wrap out the leg and tourniquet was inflated to 300 mm of pressure.  We then made incision directly over the Achilles and carried this proximally and distally.  We dissected down to the paratenon, which we divided and found a complete shredded rupture of the Achilles tendon.  We cleaned up the free margins and then placed #2 FiberWire suture in a Krackow format in both the distal end and the proximal end and then we brought these back together and showed them down.  We then oversewed the repair with interrupted #1 Vicryl suture. We used 0 Vicryl to bring the paratenon back over our repair and then 0 Vicryl in the deep tissue, 2-0 Vicryl in the subcutaneous tissue, and interrupted 2-0 nylon suture on the skin.  Xeroform and well-padded sterile dressing were applied.  Her foot was placed in a plantar flexed position and a splint.  She was awakened, extubated and taken to the recovery room  in stable condition.  All final counts were correct. There were no complications noted.  Of note, Samantha CanalGilbert Clark, PA-C, assisted in the entire case.     Samantha Friedman, M.D.   ______________________________ Samantha Friedman, M.D.    CYB/MEDQ  D:  01/14/2016  T:  01/15/2016  Job:  [id number]

## 2016-01-15 NOTE — Telephone Encounter (Signed)
Please advise 

## 2016-01-20 NOTE — ED Provider Notes (Signed)
[facility]-EMER[facility]ENCY DEPT Provider Note   CSN: [id number] Arrival date & time: 01/11/16  2255     History   Chief Complaint Chief Complaint  Patient presents with  . Ankle Pain    HPI Samantha Friedman is a 36 y.o. female.  Patient presents with complaint of left ankle pain after injury while playing basketball. She states she jumped and heard a "pop" when she came back down on the left foot. She complains of pain in the posterior aspect. No significant swelling. Weight bearing is difficult. No other injury. No numbness or wound.    The history is provided by the patient. No language interpreter was used.  Ankle Pain   Pertinent negatives include no numbness.    Past Medical History:  Diagnosis Date  . Anemia   . Scoliosis     Patient Active Problem List   Diagnosis Date Noted  . Rupture of left Achilles tendon 01/14/2016    Past Surgical History:  Procedure Laterality Date  . ACHILLES TENDON SURGERY Left 01/14/2016   Procedure: DIRECT PRIMARY REPAIR LEFT ACHILLES TENDON;  Surgeon: Kathryne Hitch, MD;  Location: [facility];  Service: Orthopedics;  Laterality: Left;  . broken thumb     . FRACTURE SURGERY      OB History    No data available       Home Medications    Prior to Admission medications   Medication Sig Start Date End Date Taking? Authorizing Provider  oxyCODONE-acetaminophen (PERCOCET/ROXICET) 5-325 MG tablet Take 1-2 tablets by mouth every 4 (four) hours as needed for severe pain. 01/14/16   Kathryne Hitch, MD  PREVIDENT 5000 PLUS 1.1 % CREA dental cream Place 1 application onto teeth at bedtime. 10/26/15   Historical Provider, MD  TRINESSA, 28, 0.18/0.215/0.25 MG-35 MCG tablet TAKE 1 TABLET BY MOUTH EVERY DAY 01/15/16   Pearline Cables, MD    Family History Family History  Problem Relation Age of Onset  . Diabetes Father   . Cancer Maternal Grandfather     Social History Social History  Substance Use Topics  . Smoking status: Never  Smoker  . Smokeless tobacco: Never Used  . Alcohol use No     Allergies   Latex   Review of Systems Review of Systems  Constitutional: Negative for diaphoresis.  Musculoskeletal:       See HPI.  Skin: Negative.  Negative for color change and wound.  Neurological: Negative.  Negative for numbness.     Physical Exam Updated Vital Signs BP 136/87   Pulse 87   Temp 98.2 F (36.8 C) (Oral)   Resp 19   LMP 12/30/2015 (Approximate)   SpO2 100%   Physical Exam  Constitutional: She is oriented to person, place, and time. She appears well-developed and well-nourished.  Neck: Normal range of motion.  Pulmonary/Chest: Effort normal.  Musculoskeletal:  Left ankle is unremarkable in appearance. No bony deformity. Joint stable. There is laxity of Achilles tendon without full rupture. Thompson's test indeterminate. Calf nontender.   Neurological: She is alert and oriented to person, place, and time.  Skin: Skin is warm and dry.     ED Treatments / Results  Labs (all labs ordered are listed, but only abnormal results are displayed) Labs Reviewed - No data to display  EKG  EKG Interpretation None       Radiology No results found. Dg Ankle Complete Left  Result Date: 01/11/2016 CLINICAL DATA:  Felt a pop while playing basketball, with left posterior  ankle pain. Initial encounter. EXAM: LEFT ANKLE COMPLETE - 3+ VIEW COMPARISON:  None. FINDINGS: There is no definite evidence of fracture or dislocation. A tiny osseous fragment adjacent to the medial malleolus may reflect remote injury. The ankle mortise is intact; the interosseous space is within normal limits. No talar tilt or subluxation is seen. The joint spaces are preserved. Slight attenuation of the musculature extending to the Achilles tendon is nonspecific and may be artifactual in nature. IMPRESSION: 1. No definite evidence of acute fracture or dislocation. 2. Tiny osseous fragment adjacent to the medial malleolus may  reflect remote injury. Electronically Signed   By: Roanna RaiderJeffery  Chang M.D.   On: 01/11/2016 23:57   Procedures Procedures (including critical care time)  Medications Ordered in ED Medications  ibuprofen (ADVIL,MOTRIN) tablet 800 mg (800 mg Oral Given 01/12/16 0542)     Initial Impression / Assessment and Plan / ED Course  I have reviewed the triage vital signs and the nursing notes.  Pertinent labs & imaging results that were available during my care of the patient were reviewed by me and considered in my medical decision making (see chart for details).  Clinical Course     Patient presents after left ankle injury. Imaging shows no fractures. Some laxity to Achilles tendon with tenderness specific to tendon. Suspect partial rupture. Patient is placed in a posterior splint with approx. 45 degree of plantar flexion. Weight bearing as tolerated with crutches. Refer to ortho.  Final Clinical Impressions(s) / ED Diagnoses   Final diagnoses:  Partial tear of left Achilles tendon, initial encounter    New Prescriptions Discharge Medication List as of 01/12/2016  5:47 AM    START taking these medications   Details  oxyCODONE-acetaminophen (PERCOCET/ROXICET) 5-325 MG tablet Take 1 tablet by mouth every 4 (four) hours as needed for severe pain., Starting Tue 01/12/2016, Print         EtowahShari Jaylen Claude, PA-C 01/20/16 [id number]    Gilda Creasehristopher J Pollina, MD 01/21/16 [phone]

## 2016-01-25 ENCOUNTER — Telehealth (INDEPENDENT_AMBULATORY_CARE_PROVIDER_SITE_OTHER): Payer: Self-pay | Admitting: *Deleted

## 2016-01-28 ENCOUNTER — Ambulatory Visit (INDEPENDENT_AMBULATORY_CARE_PROVIDER_SITE_OTHER): Payer: 59 | Admitting: Physician Assistant

## 2016-01-28 ENCOUNTER — Encounter (INDEPENDENT_AMBULATORY_CARE_PROVIDER_SITE_OTHER): Payer: Self-pay | Admitting: Physician Assistant

## 2016-01-28 DIAGNOSIS — S86012S Strain of left Achilles tendon, sequela: Secondary | ICD-10-CM

## 2016-01-28 MED ORDER — IBUPROFEN 800 MG PO TABS: 800.0000 mg | ORAL_TABLET | Freq: Three times a day (TID) | ORAL | 0 refills | Status: DC

## 2016-01-28 NOTE — Progress Notes (Signed)
   Office Visit Note   Patient: Samantha Friedman           Date of Birth: 03/22/1980           MRN: [id number] Visit Date: 01/28/2016              Requested by: No referring provider defined for this encounter. PCP: No PCP Per Patient   Assessment & Plan: Visit Diagnoses:  1. Rupture of left Achilles tendon, sequela     Plan: Sutures removed today Steri-Strips applied. She's placing Cam Walker boot with 9/16 heel lifts. She'll remain nonweightbearing for the next 2 weeks. Then partial weight bearing with one lift in place in the SYSCOCam Walker boot. Elevation leg wiggling toes encouraged. We will see her back in 1 month. He'll range of motion of the ankle encouraged..  Follow-Up Instructions: Return in about 4 weeks (around 02/25/2016).   Orders:  No orders of the defined types were placed in this encounter.  Meds ordered this encounter  Medications  . ibuprofen (ADVIL,MOTRIN) 800 MG tablet    Sig: Take 1 tablet (800 mg total) by mouth 3 (three) times daily.    Dispense:  30 tablet    Refill:  0      Procedures: No procedures performed   Clinical Data: No additional findings.   Subjective: Chief Complaint  Patient presents with  . Left Foot - Routine Post Op    HPI Mrs. Samantha Friedman returns now 2 weeks status post direct primary repair of left Achilles tendon rupture. She is overall doing well . No fevers chills shortness breath calf pain Review of Systems   Objective: Vital Signs: LMP 12/30/2015 (Approximate)   Physical Exam  Ortho Exam Left foot dorsal pedal pulse present sensation intact calf supple nontender Thompson test is negative. Skin well approximated with interrupted nylon sutures no signs of infection. Specialty Comments:  No specialty comments available.  Imaging: No results found.   PMFS History: Patient Active Problem List   Diagnosis Date Noted  . Rupture of left Achilles tendon 01/14/2016   Past Medical History:  Diagnosis Date  . Anemia     . Scoliosis     Family History  Problem Relation Age of Onset  . Diabetes Father   . Cancer Maternal Grandfather     Past Surgical History:  Procedure Laterality Date  . ACHILLES TENDON SURGERY Left 01/14/2016   Procedure: DIRECT PRIMARY REPAIR LEFT ACHILLES TENDON;  Surgeon: Kathryne Hitchhristopher Y Blackman, MD;  Location: [facility];  Service: Orthopedics;  Laterality: Left;  . broken thumb     . FRACTURE SURGERY     Social History   Occupational History  . Not on file.   Social History Main Topics  . Smoking status: Never Smoker  . Smokeless tobacco: Never Used  . Alcohol use No  . Drug use: No  . Sexual activity: Yes

## 2016-02-02 NOTE — Telephone Encounter (Signed)
Sent for PA

## 2016-02-25 ENCOUNTER — Ambulatory Visit (INDEPENDENT_AMBULATORY_CARE_PROVIDER_SITE_OTHER): Payer: 59 | Admitting: Orthopaedic Surgery

## 2016-02-25 DIAGNOSIS — S86012D Strain of left Achilles tendon, subsequent encounter: Secondary | ICD-10-CM

## 2016-02-25 MED ORDER — IBUPROFEN 800 MG PO TABS: 800.0000 mg | ORAL_TABLET | Freq: Three times a day (TID) | ORAL | 0 refills | Status: DC | PRN

## 2016-02-25 NOTE — Progress Notes (Signed)
Patient is now 6 weeks status post a direct primary repair of her left Achilles tendon. She is able with crutches but putting full weight in a Cam Walker with a heel buildup.  On examination of her left Achilles is intact. There is swelling knows infection. She has limited range of motion of her ankle.  This point we'll send her to outpatient physical therapy to work on his motion of her left ankle as well as strengthening. She will remove the heel buildup from her cam walker and try a Cam Walker for just only 2 more weeks. She'll continue weightbearing as tolerated. I'll have physical therapy also work on balance coordination and proprioception. We'll see her back in 4 weeks' 0 is doing overall.

## 2016-03-01 ENCOUNTER — Encounter: Payer: Self-pay | Admitting: Physical Therapy

## 2016-03-01 ENCOUNTER — Ambulatory Visit: Payer: 59 | Attending: Orthopaedic Surgery | Admitting: Physical Therapy

## 2016-03-01 DIAGNOSIS — R262 Difficulty in walking, not elsewhere classified: Secondary | ICD-10-CM

## 2016-03-01 DIAGNOSIS — M25672 Stiffness of left ankle, not elsewhere classified: Secondary | ICD-10-CM | POA: Diagnosis present

## 2016-03-01 DIAGNOSIS — M25572 Pain in left ankle and joints of left foot: Secondary | ICD-10-CM | POA: Insufficient documentation

## 2016-03-01 DIAGNOSIS — R600 Localized edema: Secondary | ICD-10-CM | POA: Diagnosis present

## 2016-03-01 NOTE — Therapy (Signed)
[facility] [address] [place], [name], [address] Phone: [phone]   Fax:  [phone]  Physical Therapy Evaluation  Patient Details  Name: Samantha Friedman MRN: [id number] Date of Birth: 10/14/1980 Referring Provider: C. Magnus Ivan, MD  Encounter Date: 03/01/2016      PT End of Session - 03/01/16 1634    Visit Number 1   Number of Visits 17   Date for PT Re-Evaluation 04/29/16   Authorization Type [employer]   PT Start Time 1633   PT Stop Time 1711   PT Time Calculation (min) 38 min   Activity Tolerance Patient tolerated treatment well   Behavior During Therapy WFL for tasks assessed/performed      Past Medical History:  Diagnosis Date  . Anemia   . Scoliosis     Past Surgical History:  Procedure Laterality Date  . ACHILLES TENDON SURGERY Left 01/14/2016   Procedure: DIRECT PRIMARY REPAIR LEFT ACHILLES TENDON;  Surgeon: Kathryne Hitch, MD;  Location: [facility];  Service: Orthopedics;  Laterality: Left;  . broken thumb     . FRACTURE SURGERY      There were no vitals filed for this visit.       Subjective Assessment - 03/01/16 1635    Subjective Tore achilles tendon when playing basketball, made a quick turn and felt a pop. Feels okay now, still has swelling, has not tried putting weight independently at this point. Boot for 2 more weeks.    Currently in Pain? No/denies            [facility] PT Assessment - 03/01/16 0001      Assessment   Medical Diagnosis L achilles repair   Referring Provider Maureen Ralphs, MD   Onset Date/Surgical Date 01/14/16   Next MD Visit 1 month   Prior Therapy no     Precautions   Precautions None     Restrictions   Other Position/Activity Restrictions WBAT     Balance Screen   Has the patient fallen in the past 6 months Yes   How many times? 1   Has the patient had a decrease in activity level because of a fear of falling?  No     Home Public house manager residence   Living Arrangements Alone   Additional Comments two story home     Prior Function   Level of Independence Independent     Cognition   Overall Cognitive Status Within Functional Limits for tasks assessed     Observation/Other Assessments   Focus on Therapeutic Outcomes (FOTO)  40% ability     Sensation   Additional Comments WFL     ROM / Strength   AROM / PROM / Strength AROM;PROM;Strength     AROM   AROM Assessment Site Ankle   Right/Left Ankle Left   Left Ankle Dorsiflexion -12     PROM   PROM Assessment Site Ankle   Right/Left Ankle Left   Left Ankle Dorsiflexion -12     Strength   Overall Strength Comments average ankle strength 2+/5     Palpation   Palpation comment denies TTP, figure 8-61 cm     Ambulation/Gait   Gait Comments bilat axillary crutch, not weight bearing                   [facility] Adult PT Treatment/Exercise - 03/01/16 0001      Exercises   Exercises Ankle     Ankle Exercises:  Standing   Other Standing Ankle Exercises weight shift at counter   Other Standing Ankle Exercises gait training with crutches     Ankle Exercises: Supine   Other Supine Ankle Exercises inversion/eversion, PF/DF   Other Supine Ankle Exercises toe curls, toe yoga                PT Education - 03/01/16 1718    Education provided Yes   Education Details anatomy of condition, POC, HEP, exercise form/rationale, weight bearing, skin care   Person(s) Educated Patient   Methods Explanation;Demonstration;Tactile cues;Verbal cues;Handout   Comprehension Verbalized understanding;Returned demonstration;Verbal cues required;Tactile cues required;Need further instruction          PT Short Term Goals - 03/01/16 1725      PT SHORT TERM GOAL #1   Title Pt will demo proper heel toe gait pattern for at least 50 ft without use of AD by 3/30   Baseline utilizing bilateral crutches at eval   Time 4   Period Weeks   Status New     PT SHORT  TERM GOAL #2   Title DF ROM to at least 5 deg passively    Baseline -12 at eval   Time 4   Period Weeks   Status New           PT Long Term Goals - 03/01/16 1727      PT LONG TERM GOAL #1   Title FOTO to 66% ability to indicate significant improvement in functional ability by 4/27   Baseline 42% ability   Time 8   Period Weeks   Status New     PT LONG TERM GOAL #2   Title DF ROM to at least 10 deg for appropriate ROM for gait pattern   Baseline -12 at eval   Time 12   Period Weeks   Status New     PT LONG TERM GOAL #3   Title Pt will demo 5/5 MMT for all hip and ankle MMT to indicate appropraite support to joints by surrounding musculature   Baseline not tested at eval   Time 8   Period Weeks   Status New     PT LONG TERM GOAL #4   Title Pt will demo ability to jog lighly in order to return to PLOF and prior exercise program, ankle pain <=2/10   Baseline unable at eval   Time 8   Period Weeks   Status New               Plan - 03/01/16 1719    Clinical Impression Statement Pt presents to PT with complaints of limited ankle mobility and functional use. Pt with moderate swelling at ankle and foot due to lack of muscular engagement, dryness noted to skin and pt was educated on proper cleaning and care. Pt is WBAT which we began with crutches and boot but will wear a tennis shoe at next visit to decrease LLD caused by boot. Pt will be allowed to remove boot 3/8. Pt has a history of L ankle sprain indicating poor ankle stability present prior to achilles injury that will benefit from stabilization exercises. Pt was very nervous about ankle moving into DF so measure today was at -12. Educated pt on mobility and progression and will benefit from skilled PT to appropriately progress as tolerated and reach functional goals.    Rehab Potential Good   PT Frequency 2x / week   PT Duration 8 weeks   PT  Treatment/Interventions ADLs/Self Care Home  Management;Cryotherapy;Electrical Stimulation;Stair training;Functional mobility training;Gait training;Traction;Moist Heat;Therapeutic activities;Therapeutic exercise;Balance training;Neuromuscular re-education;Patient/family education;Passive range of motion;Scar mobilization;Manual techniques;Taping;Vasopneumatic Device   PT Next Visit Plan weight bearing in tennis shoe, nu step or bike   PT Home Exercise Plan toe yoga, toe scrunches, inversion/eversion/PF/DF, partial weight bearing   Consulted and Agree with Plan of Care Patient      Patient will benefit from skilled therapeutic intervention in order to improve the following deficits and impairments:  Abnormal gait, Decreased range of motion, Difficulty walking, Increased fascial restricitons, Decreased activity tolerance, Pain, Impaired flexibility, Decreased scar mobility, Decreased balance, Decreased strength  Visit Diagnosis: Pain in left ankle and joints of left foot - Plan: PT plan of care cert/re-cert  Stiffness of left ankle, not elsewhere classified - Plan: PT plan of care cert/re-cert  Localized edema - Plan: PT plan of care cert/re-cert  Difficulty in walking, not elsewhere classified - Plan: PT plan of care cert/re-cert     Problem List Patient Active Problem List   Diagnosis Date Noted  . Rupture of left Achilles tendon 01/14/2016   Aleigh Grunden C. Selso Mannor PT, DPT 03/01/16 5:31 PM   [facility] [address] [place], [place], [address] Phone: [phone]   Fax:  [phone]  Name: Samantha Friedman MRN: [id number] Date of Birth: 10/29/1980

## 2016-03-08 ENCOUNTER — Encounter: Payer: Self-pay | Admitting: Physical Therapy

## 2016-03-08 ENCOUNTER — Ambulatory Visit: Payer: 59 | Attending: Orthopaedic Surgery | Admitting: Physical Therapy

## 2016-03-08 DIAGNOSIS — M25672 Stiffness of left ankle, not elsewhere classified: Secondary | ICD-10-CM | POA: Diagnosis present

## 2016-03-08 DIAGNOSIS — R600 Localized edema: Secondary | ICD-10-CM | POA: Diagnosis present

## 2016-03-08 DIAGNOSIS — R262 Difficulty in walking, not elsewhere classified: Secondary | ICD-10-CM | POA: Insufficient documentation

## 2016-03-08 DIAGNOSIS — M25572 Pain in left ankle and joints of left foot: Secondary | ICD-10-CM | POA: Diagnosis not present

## 2016-03-08 NOTE — Therapy (Signed)
[facility] [name] [place], [place], [address] Phone: [phone]   Fax:  [phone]  Physical Therapy Treatment  Patient Details  Name: Samantha Friedman MRN: [id number] Date of Birth: 01/29/1980 Referring Provider: C. Magnus IvanBlackman, MD  Encounter Date: 03/08/2016      PT End of Session - 03/08/16 1416    Visit Number 2   Number of Visits 17   Date for PT Re-Evaluation 04/29/16   Authorization Type UHC   PT Start Time 1416   PT Stop Time 1510   PT Time Calculation (min) 54 min   Activity Tolerance Patient tolerated treatment well   Behavior During Therapy WFL for tasks assessed/performed      Past Medical History:  Diagnosis Date  . Anemia   . Scoliosis     Past Surgical History:  Procedure Laterality Date  . ACHILLES TENDON SURGERY Left 01/14/2016   Procedure: DIRECT PRIMARY REPAIR LEFT ACHILLES TENDON;  Surgeon: Kathryne Hitchhristopher Y Blackman, MD;  Location: [facility];  Service: Orthopedics;  Laterality: Left;  . broken thumb     . FRACTURE SURGERY      There were no vitals filed for this visit.      Subjective Assessment - 03/08/16 1416    Subjective Is having some back and shoulder pain. Ankle feels good, less swelling. Not using pressure from brace   Currently in Pain? No/denies            [facility] PT Assessment - 03/08/16 0001      PROM   Left Ankle Dorsiflexion -10                     [facility] Adult PT Treatment/Exercise - 03/08/16 0001      Exercises   Exercises Knee/Hip     Knee/Hip Exercises: Stretches   Passive Hamstring Stretch Limitations long sitting with strap     Knee/Hip Exercises: Sidelying   Hip ABduction Left;20 reps;10 reps     Knee/Hip Exercises: Prone   Hip Extension Left;20 reps   Hip Extension Limitations with iso hamstring curl     Modalities   Modalities Cryotherapy     Cryotherapy   Number Minutes Cryotherapy 10 Minutes   Cryotherapy Location Ankle   Type of  Cryotherapy Ice pack     Manual Therapy   Manual Therapy Joint mobilization   Joint Mobilization tarsal and metatarsal mobilization     Ankle Exercises: Standing   Other Standing Ankle Exercises weight shift   Other Standing Ankle Exercises gait training with axillary      Ankle Exercises: Aerobic   Stationary Bike 5 min L1     Ankle Exercises: Stretches   Gastroc Stretch 2 reps;30 seconds  slant board     Ankle Exercises: Supine   Other Supine Ankle Exercises eversion yellow                  PT Short Term Goals - 03/01/16 1725      PT SHORT TERM GOAL #1   Title Pt will demo proper heel toe gait pattern for at least 50 ft without use of AD by 3/30   Baseline utilizing bilateral crutches at eval   Time 4   Period Weeks   Status New     PT SHORT TERM GOAL #2   Title DF ROM to at least 5 deg passively    Baseline -12 at eval   Time 4   Period Weeks   Status  New           PT Long Term Goals - 03/01/16 1727      PT LONG TERM GOAL #1   Title FOTO to 66% ability to indicate significant improvement in functional ability by 4/27   Baseline 42% ability   Time 8   Period Weeks   Status New     PT LONG TERM GOAL #2   Title DF ROM to at least 10 deg for appropriate ROM for gait pattern   Baseline -12 at eval   Time 12   Period Weeks   Status New     PT LONG TERM GOAL #3   Title Pt will demo 5/5 MMT for all hip and ankle MMT to indicate appropraite support to joints by surrounding musculature   Baseline not tested at eval   Time 8   Period Weeks   Status New     PT LONG TERM GOAL #4   Title Pt will demo ability to jog lighly in order to return to PLOF and prior exercise program, ankle pain <=2/10   Baseline unable at eval   Time 8   Period Weeks   Status New               Plan - 03/08/16 1506    Clinical Impression Statement pt is nervous about placing weight through ankle with and without boot, used bilateral axillary crutches today  while in ASO and tennis shoe. will be able to remove boon on 3/8   PT Next Visit Plan weight bearing in tennis shoe   PT Home Exercise Plan toe yoga, toe scrunches, inversion/eversion/PF/DF, partial weight bearing      Patient will benefit from skilled therapeutic intervention in order to improve the following deficits and impairments:     Visit Diagnosis: Pain in left ankle and joints of left foot  Stiffness of left ankle, not elsewhere classified  Localized edema  Difficulty in walking, not elsewhere classified     Problem List Patient Active Problem List   Diagnosis Date Noted  . Rupture of left Achilles tendon 01/14/2016    Nima Bamburg C. Dany Walther PT, DPT 03/08/16 3:10 PM   [facility] [address] [place], [name], [address] Phone: [phone]   Fax:  [phone]  Name: Samantha Friedman MRN: [id number] Date of Birth: Aug 31, 1980

## 2016-03-09 ENCOUNTER — Ambulatory Visit: Payer: 59 | Admitting: Physical Therapy

## 2016-03-09 ENCOUNTER — Encounter: Payer: Self-pay | Admitting: Physical Therapy

## 2016-03-09 DIAGNOSIS — R600 Localized edema: Secondary | ICD-10-CM

## 2016-03-09 DIAGNOSIS — M25572 Pain in left ankle and joints of left foot: Secondary | ICD-10-CM

## 2016-03-09 DIAGNOSIS — R262 Difficulty in walking, not elsewhere classified: Secondary | ICD-10-CM

## 2016-03-09 DIAGNOSIS — M25672 Stiffness of left ankle, not elsewhere classified: Secondary | ICD-10-CM

## 2016-03-09 NOTE — Therapy (Signed)
[facility] [address] [place], [name], [address] Phone: [phone]   Fax:  [phone]  Physical Therapy Treatment  Patient Details  Name: Samantha Friedman MRN: [id number] Date of Birth: May 01, 1980 Referring Provider: C. Magnus Ivan, MD  Encounter Date: 03/09/2016      PT End of Session - 03/09/16 1149    Visit Number 3   Number of Visits 17   Date for PT Re-Evaluation 04/29/16   Authorization Type UHC   PT Start Time 1149   PT Stop Time 1242   PT Time Calculation (min) 53 min   Activity Tolerance Patient tolerated treatment well   Behavior During Therapy WFL for tasks assessed/performed      Past Medical History:  Diagnosis Date  . Anemia   . Scoliosis     Past Surgical History:  Procedure Laterality Date  . ACHILLES TENDON SURGERY Left 01/14/2016   Procedure: DIRECT PRIMARY REPAIR LEFT ACHILLES TENDON;  Surgeon: Kathryne Hitch, MD;  Location: [facility];  Service: Orthopedics;  Laterality: Left;  . broken thumb     . FRACTURE SURGERY      There were no vitals filed for this visit.      Subjective Assessment - 03/09/16 1149    Subjective Pt reports ankle is swollen today. has been sitting at work. Denies pain.    Currently in Pain? No/denies                         [facility] Adult PT Treatment/Exercise - 03/09/16 0001      Cryotherapy   Number Minutes Cryotherapy 10 Minutes   Cryotherapy Location Ankle   Type of Cryotherapy Ice pack     Manual Therapy   Manual Therapy Edema management;Passive ROM   Edema Management edema mobilization out of foot and ankle   Passive ROM manual DF stretch     Ankle Exercises: Supine   Other Supine Ankle Exercises ankle ABCs    Other Supine Ankle Exercises toe curl/extend     Ankle Exercises: Standing   Other Standing Ankle Exercises weight shift onto scale   Other Standing Ankle Exercises gait training with crutches, ASO, tennis shoe     Ankle  Exercises: Stretches   Gastroc Stretch 2 reps;30 seconds   Other Stretch long sitting hamstring/gastroc stretch                PT Education - 03/09/16 1152    Education provided Yes   Education Details exercise form/rationale, swelling, elevating at work   [employer]) Educated Patient   Methods Explanation;Demonstration;Tactile cues;Verbal cues   Comprehension Verbalized understanding;Returned demonstration;Verbal cues required;Tactile cues required;Need further instruction          PT Short Term Goals - 03/01/16 1725      PT SHORT TERM GOAL #1   Title Pt will demo proper heel toe gait pattern for at least 50 ft without use of AD by 3/30   Baseline utilizing bilateral crutches at eval   Time 4   Period Weeks   Status New     PT SHORT TERM GOAL #2   Title DF ROM to at least 5 deg passively    Baseline -12 at eval   Time 4   Period Weeks   Status New           PT Long Term Goals - 03/01/16 1727      PT LONG TERM GOAL #1   Title FOTO to  66% ability to indicate significant improvement in functional ability by 4/27   Baseline 42% ability   Time 8   Period Weeks   Status New     PT LONG TERM GOAL #2   Title DF ROM to at least 10 deg for appropriate ROM for gait pattern   Baseline -12 at eval   Time 12   Period Weeks   Status New     PT LONG TERM GOAL #3   Title Pt will demo 5/5 MMT for all hip and ankle MMT to indicate appropraite support to joints by surrounding musculature   Baseline not tested at eval   Time 8   Period Weeks   Status New     PT LONG TERM GOAL #4   Title Pt will demo ability to jog lighly in order to return to PLOF and prior exercise program, ankle pain <=2/10   Baseline unable at eval   Time 8   Period Weeks   Status New               Plan - 03/09/16 1209    Clinical Impression Statement Edema notable today in ankle and foot that was decreased with manual treatment. Increased weight bearing today in standing to begin  progressing toward neutral DF ROM to improve gait without boot. Pt unable to perform step through gait with R foot due to L limited DF. Pt also tends to keep foot in inverted position due to lack of ROM. REports pulling at achilles insertion site in slant board stretch, no gastroc pull.    PT Next Visit Plan weight bearing in tennis shoe, single foot gait on treadmill   PT Home Exercise Plan toe yoga, toe scrunches, inversion/eversion/PF/DF, partial weight bearing, DF stretch, elevate at work   [employer] and Agree with Plan of Care Patient      Patient will benefit from skilled therapeutic intervention in order to improve the following deficits and impairments:     Visit Diagnosis: Pain in left ankle and joints of left foot  Stiffness of left ankle, not elsewhere classified  Localized edema  Difficulty in walking, not elsewhere classified     Problem List Patient Active Problem List   Diagnosis Date Noted  . Rupture of left Achilles tendon 01/14/2016    [name] C. Latonyia Lopata PT, DPT 03/09/16 12:35 PM   [name] [address] [name], [name], [address] Phone: [phone]   Fax:  [phone]  Name: Samantha Friedman MRN: [id number] Date of Birth: 08/17/1980

## 2016-03-15 ENCOUNTER — Ambulatory Visit: Payer: 59 | Admitting: Physical Therapy

## 2016-03-15 DIAGNOSIS — M25572 Pain in left ankle and joints of left foot: Secondary | ICD-10-CM | POA: Diagnosis not present

## 2016-03-15 DIAGNOSIS — R600 Localized edema: Secondary | ICD-10-CM

## 2016-03-15 DIAGNOSIS — M25672 Stiffness of left ankle, not elsewhere classified: Secondary | ICD-10-CM

## 2016-03-15 DIAGNOSIS — R262 Difficulty in walking, not elsewhere classified: Secondary | ICD-10-CM

## 2016-03-15 NOTE — Therapy (Signed)
[facility] [address] [place], [place], [address] Phone: [phone]   Fax:  [phone]  Physical Therapy Treatment  Patient Details  Name: Samantha GrosShanika Hinchliffe MRN: [id number] Date of Birth: 05/03/1980 Referring Provider: C. Magnus IvanBlackman, MD  Encounter Date: 03/15/2016      PT End of Session - 03/15/16 1701    Visit Number 4   Number of Visits 17   Date for PT Re-Evaluation 04/29/16   Authorization Type UHC   PT Start Time 0345   PT Stop Time 0430   PT Time Calculation (min) 45 min      Past Medical History:  Diagnosis Date  . Anemia   . Scoliosis     Past Surgical History:  Procedure Laterality Date  . ACHILLES TENDON SURGERY Left 01/14/2016   Procedure: DIRECT PRIMARY REPAIR LEFT ACHILLES TENDON;  Surgeon: Kathryne Hitchhristopher Y Blackman, MD;  Location: [facility];  Service: Orthopedics;  Laterality: Left;  . broken thumb     . FRACTURE SURGERY      There were no vitals filed for this visit.          [facility] - 03/15/16 0001      PROM   Left Ankle Dorsiflexion -10                     [facility] Adult PT Treatment/Exercise - 03/15/16 0001      Ambulation/Gait   Ambulation/Gait Yes   Ambulation Distance (Feet) 30 Feet   Assistive device L Axillary Crutch;R Axillary Crutch   Ambulation Surface Level   Pre-Gait Activities weight shifting in tennis shoes 10 sec x 10 , standing with equal weight    Gait Comments bilat axillary crutch, partial weight bearing, step to pattern attempting to keep heel in contact with floor.      Knee/Hip Exercises: Stretches   Passive Hamstring Stretch Limitations long sitting with strap     Knee/Hip Exercises: Seated   Heel Slides 20 reps     Manual Therapy   Manual Therapy Edema management;Passive ROM   Edema Management edema mobilization out of foot and ankle, and lower leg    Passive ROM manual DF stretch     Ankle Exercises: Supine   Other Supine Ankle  Exercises toe curl/extend                PT Education - 03/15/16 1701    Education provided Yes   Education Details HEP    Person(s) Educated Patient   Methods Explanation;Handout   Comprehension Verbalized understanding          PT Short Term Goals - 03/01/16 1725      PT SHORT TERM GOAL #1   Title Pt will demo proper heel toe gait pattern for at least 50 ft without use of AD by 3/30   Baseline utilizing bilateral crutches at eval   Time 4   Period Weeks   Status New     PT SHORT TERM GOAL #2   Title DF ROM to at least 5 deg passively    Baseline -12 at eval   Time 4   Period Weeks   Status New           PT Long Term Goals - 03/01/16 1727      PT LONG TERM GOAL #1   Title FOTO to 66% ability to indicate significant improvement in functional ability by 4/27   Baseline 42% ability   Time 8  Period Weeks   Status New     PT LONG TERM GOAL #2   Title DF ROM to at least 10 deg for appropriate ROM for gait pattern   Baseline -12 at eval   Time 12   Period Weeks   Status New     PT LONG TERM GOAL #3   Title Pt will demo 5/5 MMT for all hip and ankle MMT to indicate appropraite support to joints by surrounding musculature   Baseline not tested at eval   Time 8   Period Weeks   Status New     PT LONG TERM GOAL #4   Title Pt will demo ability to jog lighly in order to return to PLOF and prior exercise program, ankle pain <=2/10   Baseline unable at eval   Time 8   Period Weeks   Status New               Plan - 03/15/16 1728    Clinical Impression Statement Increased edema noted in lower leg and ankle. No warmth redness or pain in calf. Reto massage techniques used to decrease edeam. Also education provided on proper elevation. She has not lifted her ankle higher than her heart and she has been sitting alot for work. Reviewed her DF stretcing. Strap in long sitting, heel slides ehile sitting. We began weight shifting in regular shoe and  bilateral crutches. Asked to to continue with bil crutches and perform step- to gait trying to keep heel on floor with only as much weight that is comfortable. Asked her to only due this when comfortable. Pt verbalized understanding. She continues to lack 10 degrees of DF. ROM is limited by edema today. Instructed pt to call us if her calf becomes warm, painful or the edema does not decrease.    PT Next Visit Plan weight bearing in tennis shoe, single foot gait on treadmill, DF stretching    PT Home Exercise Plan toe yoga, toe scrunches, inversion/eversion/PF/DF, partial weight bearing, DF stretch, elevate at work   [employer] and Agree with Plan of Care Patient      Patient will benefit from skilled therapeutic intervention in order to improve the following deficits and impairments:  Abnormal gait, Decreased range of motion, Difficulty walking, Increased fascial restricitons, Decreased activity tolerance, Pain, Impaired flexibility, Decreased scar mobility, Decreased balance, Decreased strength  Visit Diagnosis: Pain in left ankle and joints of left foot  Stiffness of left ankle, not elsewhere classified  Localized edema  Difficulty in walking, not elsewhere classified     Problem List Patient Active Problem List   Diagnosis Date Noted  . Rupture of left Achilles tendon 01/14/2016    Samantha Friedman, PTA 03/15/2016, 5:36 PM  [facility] [address] [place], [name], [address] Phone: [phone]   Fax:  [phone]  Name: Samantha Friedman MRN: [id number] Date of Birth: 07/19/1980

## 2016-03-17 ENCOUNTER — Encounter: Payer: Self-pay | Admitting: Physical Therapy

## 2016-03-17 ENCOUNTER — Ambulatory Visit: Payer: 59 | Admitting: Physical Therapy

## 2016-03-17 DIAGNOSIS — M25572 Pain in left ankle and joints of left foot: Secondary | ICD-10-CM | POA: Diagnosis not present

## 2016-03-17 DIAGNOSIS — M25672 Stiffness of left ankle, not elsewhere classified: Secondary | ICD-10-CM

## 2016-03-17 DIAGNOSIS — R600 Localized edema: Secondary | ICD-10-CM

## 2016-03-17 DIAGNOSIS — R262 Difficulty in walking, not elsewhere classified: Secondary | ICD-10-CM

## 2016-03-17 NOTE — Therapy (Signed)
[facility] [address] [place], [name], [address] Phone: [phone]   Fax:  [phone]  Physical Therapy Treatment  Patient Details  Name: Samantha Friedman MRN: [id number] Date of Birth: 02/23/1980 Referring Provider: C. Magnus Ivan, MD  Encounter Date: 03/17/2016      PT End of Session - 03/17/16 1631    Visit Number 5   Number of Visits 17   Date for PT Re-Evaluation 04/29/16   Authorization Type UHC   PT Start Time 1631   PT Stop Time 1730   PT Time Calculation (min) 59 min   Activity Tolerance Patient tolerated treatment well   Behavior During Therapy WFL for tasks assessed/performed      Past Medical History:  Diagnosis Date  . Anemia   . Scoliosis     Past Surgical History:  Procedure Laterality Date  . ACHILLES TENDON SURGERY Left 01/14/2016   Procedure: DIRECT PRIMARY REPAIR LEFT ACHILLES TENDON;  Surgeon: Kathryne Hitch, MD;  Location: [facility];  Service: Orthopedics;  Laterality: Left;  . broken thumb     . FRACTURE SURGERY      There were no vitals filed for this visit.      Subjective Assessment - 03/17/16 1635    Subjective pt reports feeling much better today, less swelling   Currently in Pain? No/denies                         [facility] Adult PT Treatment/Exercise - 03/17/16 0001      Knee/Hip Exercises: Stretches   Gastroc Stretch 30 seconds   Gastroc Stretch Limitations slant board   Soleus Stretch 30 seconds   Soleus Stretch Limitations slant board     Knee/Hip Exercises: Aerobic   Nustep 5 min L4     Knee/Hip Exercises: Standing   Gait Training heel/toe, hip ext  holding crutches, trying not to put weight into them   Other Standing Knee Exercises step back weight into toes     Cryotherapy   Number Minutes Cryotherapy 10 Minutes   Cryotherapy Location Ankle   Type of Cryotherapy Ice pack     Manual Therapy   Manual Therapy Soft tissue mobilization   Manual  therapy comments removed steri strips due to smell, oozing and moisture.    Soft tissue mobilization IASTM L gastroc in prone  paired with passive DF stretch     Ankle Exercises: Seated   BAPS Level 2   Other Seated Ankle Exercises seated rocker, bilateral feet                PT Education - 03/17/16 1635    Education provided Yes   Education Details exercise form/rationale   Person(s) Educated Patient   Methods Explanation;Demonstration;Tactile cues;Verbal cues   Comprehension Verbalized understanding;Returned demonstration;Verbal cues required;Tactile cues required;Need further instruction          PT Short Term Goals - 03/01/16 1725      PT SHORT TERM GOAL #1   Title Pt will demo proper heel toe gait pattern for at least 50 ft without use of AD by 3/30   Baseline utilizing bilateral crutches at eval   Time 4   Period Weeks   Status New     PT SHORT TERM GOAL #2   Title DF ROM to at least 5 deg passively    Baseline -12 at eval   Time 4   Period Weeks   Status New  PT Long Term Goals - 03/01/16 1727      PT LONG TERM GOAL #1   Title FOTO to 66% ability to indicate significant improvement in functional ability by 4/27   Baseline 42% ability   Time 8   Period Weeks   Status New     PT LONG TERM GOAL #2   Title DF ROM to at least 10 deg for appropriate ROM for gait pattern   Baseline -12 at eval   Time 12   Period Weeks   Status New     PT LONG TERM GOAL #3   Title Pt will demo 5/5 MMT for all hip and ankle MMT to indicate appropraite support to joints by surrounding musculature   Baseline not tested at eval   Time 8   Period Weeks   Status New     PT LONG TERM GOAL #4   Title Pt will demo ability to jog lighly in order to return to PLOF and prior exercise program, ankle pain <=2/10   Baseline unable at eval   Time 8   Period Weeks   Status New               Plan - 03/17/16 1725    Clinical Impression Statement Pt was  able to stand centered with both feet flat today, verbalizing stretch but no pain. Incision today was noted to be very moist with odor and flaking skin cells. Steri strips were removed to check for infection. No evidence of infection. Provided pt with gauze pads ant tape to cover and educated on what to buy from [facility] for coverage. Pt cont to lack hip ext and toe off in gait due to lack of DF ROM which was worked on today.    PT Next Visit Plan weight bearing in tennis shoe, single foot gait on treadmill, DF stretching; check incision    Consulted and Agree with Plan of Care Patient      Patient will benefit from skilled therapeutic intervention in order to improve the following deficits and impairments:     Visit Diagnosis: Pain in left ankle and joints of left foot  Stiffness of left ankle, not elsewhere classified  Localized edema  Difficulty in walking, not elsewhere classified     Problem List Patient Active Problem List   Diagnosis Date Noted  . Rupture of left Achilles tendon 01/14/2016    Samantha Friedman PT, DPT 03/17/16 5:29 PM   [facility] [address] [place], [place], [address] Phone: [phone]   Fax:  [phone]  Name: Samantha Friedman MRN: [id number] Date of Birth: 12/27/1980

## 2016-03-20 ENCOUNTER — Other Ambulatory Visit (INDEPENDENT_AMBULATORY_CARE_PROVIDER_SITE_OTHER): Payer: Self-pay | Admitting: Orthopaedic Surgery

## 2016-03-22 ENCOUNTER — Ambulatory Visit: Payer: 59 | Admitting: Physical Therapy

## 2016-03-22 ENCOUNTER — Encounter: Payer: Self-pay | Admitting: Physical Therapy

## 2016-03-22 DIAGNOSIS — M25572 Pain in left ankle and joints of left foot: Secondary | ICD-10-CM | POA: Diagnosis not present

## 2016-03-22 DIAGNOSIS — M25672 Stiffness of left ankle, not elsewhere classified: Secondary | ICD-10-CM

## 2016-03-22 DIAGNOSIS — R600 Localized edema: Secondary | ICD-10-CM

## 2016-03-22 DIAGNOSIS — R262 Difficulty in walking, not elsewhere classified: Secondary | ICD-10-CM

## 2016-03-22 NOTE — Patient Instructions (Signed)
From exercise drawer: Daily   10 -20 X each Supination/pronation    Foot/ toe stretch flexion, extension

## 2016-03-22 NOTE — Therapy (Signed)
[facility] [address] [place], [place], [address] Phone: [phone]   Fax:  [phone]  Physical Therapy Treatment  Patient Details  Name: Samantha GrosShanika Friedman MRN: [id number] Date of Birth: 11/27/1980 Referring Provider: C. Magnus IvanBlackman, MD  Encounter Date: 03/22/2016      PT End of Session - 03/22/16 1748    Visit Number 6   Number of Visits 17   Date for PT Re-Evaluation 04/29/16   PT Start Time 1632   PT Stop Time 1730   PT Time Calculation (min) 58 min   Activity Tolerance Patient tolerated treatment well   Behavior During Therapy [facility] for tasks assessed/performed      Past Medical History:  Diagnosis Date  . Anemia   . Scoliosis     Past Surgical History:  Procedure Laterality Date  . ACHILLES TENDON SURGERY Left 01/14/2016   Procedure: DIRECT PRIMARY REPAIR LEFT ACHILLES TENDON;  Surgeon: Kathryne Hitchhristopher Y Blackman, MD;  Location: [facility];  Service: Orthopedics;  Laterality: Left;  . broken thumb     . FRACTURE SURGERY      There were no vitals filed for this visit.      Subjective Assessment - 03/22/16 1637    Subjective Last pain inner thigh from hip adductor stretch.    Currently in Pain? No/denies                         [facility] - 03/22/16 0001      Knee/Hip Exercises: Stretches   Gastroc Stretch 5 reps   Theme park managerGastroc Stretch Limitations with ankle anterior joint braced     Knee/Hip Exercises: Aerobic   Nustep 5 min L4     Cryotherapy   Number Minutes Cryotherapy 10 Minutes   Cryotherapy Location Ankle   Type of Cryotherapy --  cold pack, leg elevated     Manual Therapy   Manual Therapy Soft tissue mobilization   Manual therapy comments inspectes scar,  1/2 inch by 1/4 inch , estimated, open area distal scar .  Bandaid appilied.  Paper tape issued so more air. would get to skin.  Area under tape lighter with moisture.  No odor, no warmth noted.  Taking care to  stay away from open area,  soft tissue work to proximal scar continued, followed by PROM,  P/A mobs to toes and mid foot,     Edema Management edema, lymph system vacume activation   Passive ROM DF,  PF toes     Ankle Exercises: Stretches   Plantar Fascia Stretch --  Pro stretch 3 minutes 10 second stretches     Ankle Exercises: Seated   Heel Raises 10 reps  cued to keep toes on the floor  2 sets   Toe Raise 10 reps  DF AROM  2 sets   Other Seated Ankle Exercises IV/EV toe stretches flexion/ extension                PT Education - 03/22/16 1748    Education provided Yes   Education Details HEP   Person(s) Educated Patient   Methods Explanation;Demonstration;Verbal cues;Handout   Comprehension Verbalized understanding;Returned demonstration          PT Short Term Goals - 03/22/16 1753      PT SHORT TERM GOAL #1   Title Pt will demo proper heel toe gait pattern for at least 50 ft without use of AD by 3/30   Baseline unable to  do heel toe gait.  Uses crutches   Time 4   Period Weeks   Status On-going     PT SHORT TERM GOAL #2   Title DF ROM to at least 5 deg passively    Baseline 0   Time 4   Period Weeks   Status On-going           PT Long Term Goals - 03/01/16 1727      PT LONG TERM GOAL #1   Title FOTO to 66% ability to indicate significant improvement in functional ability by 4/27   Baseline 42% ability   Time 8   Period Weeks   Status New     PT LONG TERM GOAL #2   Title DF ROM to at least 10 deg for appropriate ROM for gait pattern   Baseline -12 at eval   Time 12   Period Weeks   Status New     PT LONG TERM GOAL #3   Title Pt will demo 5/5 MMT for all hip and ankle MMT to indicate appropraite support to joints by surrounding musculature   Baseline not tested at eval   Time 8   Period Weeks   Status New     PT LONG TERM GOAL #4   Title Pt will demo ability to jog lighly in order to return to PLOF and prior exercise program, ankle  pain <=2/10   Baseline unable at eval   Time 8   Period Weeks   Status New               Plan - 03/22/16 1749    Clinical Impression Statement Open area distal scar 1/2 X 1/4 inch, extimate size.  No warmth.  no odor.  Tape she used for bandage did not allow for air exchange.  Bandaid applied.  DF AROM -3  Able to get neutral Passively.  Gait continues to be limited due to lack of ROM. No pain at end of session   PT Next Visit Plan weight bearing in tennis shoe, single foot gait on treadmill, DF stretching; check incision See what MD said   PT Home Exercise Plan toe yoga, toe scrunches, inversion/eversion/PF/DF, partial weight bearing, DF stretch, elevate at worktoe stretching   Consulted and Agree with Plan of Care Patient      Patient will benefit from skilled therapeutic intervention in order to improve the following deficits and impairments:  Abnormal gait, Decreased range of motion, Difficulty walking, Increased fascial restricitons, Decreased activity tolerance, Pain, Impaired flexibility, Decreased scar mobility, Decreased balance, Decreased strength  Visit Diagnosis: Pain in left ankle and joints of left foot  Stiffness of left ankle, not elsewhere classified  Localized edema  Difficulty in walking, not elsewhere classified     Problem List Patient Active Problem List   Diagnosis Date Noted  . Rupture of left Achilles tendon 01/14/2016    Iain Sawchuk PTA 03/22/2016, 5:54 PM  [facility] [address] [place], [name], [address] Phone: [phone]   Fax:  [phone]  Name: Samantha Friedman MRN: [id number] Date of Birth: 1980-06-02

## 2016-03-24 ENCOUNTER — Ambulatory Visit (INDEPENDENT_AMBULATORY_CARE_PROVIDER_SITE_OTHER): Payer: 59 | Admitting: Orthopaedic Surgery

## 2016-03-24 ENCOUNTER — Encounter (INDEPENDENT_AMBULATORY_CARE_PROVIDER_SITE_OTHER): Payer: Self-pay | Admitting: Orthopaedic Surgery

## 2016-03-24 DIAGNOSIS — S86012D Strain of left Achilles tendon, subsequent encounter: Secondary | ICD-10-CM

## 2016-03-24 NOTE — Progress Notes (Signed)
Samantha Friedman is now 9 weeks status post a direct primary repair of the left Achilles tendon rupture. She's going to physical therapy. She still and bleeding was crutches and losing a lace up ankle brace. She's been working on balance coordination and proprioception through her physical therapist. She has really no complaints. She's had some mild pain and soreness.  On examination her incisions well-healed on the back of her left ankle. She has weakness of the Achilles tendon but is definitely intact.  She'll continue physical therapy and continue slowly increase her activities. She knows that recovery fully can take 6 months to a year. I'll see her back in a month to see how she is doing overall.

## 2016-03-29 ENCOUNTER — Ambulatory Visit: Payer: 59 | Admitting: Physical Therapy

## 2016-03-29 DIAGNOSIS — M25572 Pain in left ankle and joints of left foot: Secondary | ICD-10-CM

## 2016-03-29 DIAGNOSIS — R600 Localized edema: Secondary | ICD-10-CM

## 2016-03-29 DIAGNOSIS — M25672 Stiffness of left ankle, not elsewhere classified: Secondary | ICD-10-CM

## 2016-03-29 DIAGNOSIS — R262 Difficulty in walking, not elsewhere classified: Secondary | ICD-10-CM

## 2016-03-30 NOTE — Therapy (Signed)
[facility] [address] [place], [name], [address] Phone: [phone]   Fax:  [phone]  Physical Therapy Treatment  Patient Details  Name: Samantha Friedman MRN: [id number] Date of Birth: 01-02-1981 Referring Provider: C. Magnus Ivan, MD  Encounter Date: 03/29/2016      PT End of Session - 03/30/16 0909    Visit Number 7   Number of Visits 17   Date for PT Re-Evaluation 04/29/16   Authorization Type [employer]   PT Start Time 0430   PT Stop Time 0515   PT Time Calculation (min) 45 min      Past Medical History:  Diagnosis Date  . Anemia   . Scoliosis     Past Surgical History:  Procedure Laterality Date  . ACHILLES TENDON SURGERY Left 01/14/2016   Procedure: DIRECT PRIMARY REPAIR LEFT ACHILLES TENDON;  Surgeon: Kathryne Hitch, MD;  Location: [facility];  Service: Orthopedics;  Laterality: Left;  . broken thumb     . FRACTURE SURGERY      There were no vitals filed for this visit.      Subjective Assessment - 03/29/16 1649    Subjective Saw MD who said I can wean off crutches.                          [facility] Adult PT Treatment/Exercise - 03/30/16 0001      Ambulation/Gait   Pre-Gait Activities weight shifting lateral, then weight shifting forward and back, lateral stepping all in parallel bars,max cues to decrease UE assist, progressed to single UE on parallel bars with step to pattern, then back to 2 crutches, then down to 1 crutch.      Knee/Hip Exercises: Aerobic   Recumbent Bike 5 min L2                   PT Short Term Goals - 03/22/16 1753      PT SHORT TERM GOAL #1   Title Pt will demo proper heel toe gait pattern for at least 50 ft without use of AD by 3/30   Baseline unable to do heel toe gait.  Uses crutches   Time 4   Period Weeks   Status On-going     PT SHORT TERM GOAL #2   Title DF ROM to at least 5 deg passively    Baseline 0   Time 4   Period Weeks   Status  On-going           PT Long Term Goals - 03/01/16 1727      PT LONG TERM GOAL #1   Title FOTO to 66% ability to indicate significant improvement in functional ability by 4/27   Baseline 42% ability   Time 8   Period Weeks   Status New     PT LONG TERM GOAL #2   Title DF ROM to at least 10 deg for appropriate ROM for gait pattern   Baseline -12 at eval   Time 12   Period Weeks   Status New     PT LONG TERM GOAL #3   Title Pt will demo 5/5 MMT for all hip and ankle MMT to indicate appropraite support to joints by surrounding musculature   Baseline not tested at eval   Time 8   Period Weeks   Status New     PT LONG TERM GOAL #4   Title Pt will demo ability to jog [employer]  in order to return to PLOF and prior exercise program, ankle pain <=2/10   Baseline unable at eval   Time 8   Period Weeks   Status New               Plan - 03/30/16 0909    Clinical Impression Statement Pt saw MD who thought she was doing well and told her she could wean from crutches at PT discression. She continues to ambulate with bilat crutches and is placing very little weight through her LLE. Spent the entire treatment today working on increased weight bearing to LLE. Parallel bars used for weight shifting forward backward and lateral. Pt very limited by lack of DF. By end of treatment pt able to bear weight and use 1 crutch with step-to -pattern. Encouraged her to practive step to gatit for now to increase weight bearing tolerance. She is also only streching 1 x per day. I asked her to increase to 5 x per day. Hopefully  she can begin a weight bearing stretch soon.    PT Next Visit Plan weight bearing in tennis shoe, gait with increased weight bearing; single foot gait on treadmill, DF stretching; Is she stretching more during the day?    PT Home Exercise Plan toe yoga, toe scrunches, inversion/eversion/PF/DF, partial weight bearing, DF stretch, elevate at worktoe stretching   Consulted and Agree  with Plan of Care Patient      Patient will benefit from skilled therapeutic intervention in order to improve the following deficits and impairments:  Abnormal gait, Decreased range of motion, Difficulty walking, Increased fascial restricitons, Decreased activity tolerance, Pain, Impaired flexibility, Decreased scar mobility, Decreased balance, Decreased strength  Visit Diagnosis: Pain in left ankle and joints of left foot  Stiffness of left ankle, not elsewhere classified  Localized edema  Difficulty in walking, not elsewhere classified     Problem List Patient Active Problem List   Diagnosis Date Noted  . Rupture of left Achilles tendon 01/14/2016    Sherrie Mustacheonoho, Joniyah Mallinger McGee, PTA 03/30/2016, 9:22 AM  [facility] [address] [place], [place], [address] Phone: [phone]   Fax:  [phone]  Name: Rosendo GrosShanika Plato MRN: [id number] Date of Birth: 11/04/1980

## 2016-03-31 ENCOUNTER — Ambulatory Visit: Payer: 59 | Admitting: Physical Therapy

## 2016-03-31 ENCOUNTER — Emergency Department (HOSPITAL_COMMUNITY): Payer: 59

## 2016-03-31 ENCOUNTER — Encounter (HOSPITAL_COMMUNITY): Payer: Self-pay

## 2016-03-31 ENCOUNTER — Inpatient Hospital Stay (HOSPITAL_COMMUNITY)
Admission: EM | Admit: 2016-03-31 | Discharge: 2016-04-04 | DRG: 175 | Disposition: A | Discharge status: Home / Self Care | Payer: 59 | Attending: Internal Medicine | Admitting: Internal Medicine

## 2016-03-31 ENCOUNTER — Inpatient Hospital Stay (HOSPITAL_COMMUNITY): Payer: 59

## 2016-03-31 ENCOUNTER — Telehealth: Payer: Self-pay | Admitting: Physical Therapy

## 2016-03-31 DIAGNOSIS — I2721 Secondary pulmonary arterial hypertension: Secondary | ICD-10-CM | POA: Diagnosis present

## 2016-03-31 DIAGNOSIS — R079 Chest pain, unspecified: Secondary | ICD-10-CM | POA: Diagnosis present

## 2016-03-31 DIAGNOSIS — D696 Thrombocytopenia, unspecified: Secondary | ICD-10-CM | POA: Diagnosis present

## 2016-03-31 DIAGNOSIS — R310 Gross hematuria: Secondary | ICD-10-CM | POA: Diagnosis present

## 2016-03-31 DIAGNOSIS — I2602 Saddle embolus of pulmonary artery with acute cor pulmonale: Principal | ICD-10-CM

## 2016-03-31 DIAGNOSIS — Z79899 Other long term (current) drug therapy: Secondary | ICD-10-CM | POA: Diagnosis not present

## 2016-03-31 DIAGNOSIS — E8770 Fluid overload, unspecified: Secondary | ICD-10-CM | POA: Diagnosis present

## 2016-03-31 DIAGNOSIS — J9601 Acute respiratory failure with hypoxia: Secondary | ICD-10-CM | POA: Diagnosis present

## 2016-03-31 DIAGNOSIS — R739 Hyperglycemia, unspecified: Secondary | ICD-10-CM | POA: Diagnosis present

## 2016-03-31 DIAGNOSIS — M25672 Stiffness of left ankle, not elsewhere classified: Secondary | ICD-10-CM

## 2016-03-31 DIAGNOSIS — M419 Scoliosis, unspecified: Secondary | ICD-10-CM | POA: Diagnosis present

## 2016-03-31 DIAGNOSIS — I2699 Other pulmonary embolism without acute cor pulmonale: Secondary | ICD-10-CM | POA: Diagnosis not present

## 2016-03-31 DIAGNOSIS — I959 Hypotension, unspecified: Secondary | ICD-10-CM | POA: Diagnosis present

## 2016-03-31 DIAGNOSIS — I82402 Acute embolism and thrombosis of unspecified deep veins of left lower extremity: Secondary | ICD-10-CM | POA: Diagnosis present

## 2016-03-31 DIAGNOSIS — D649 Anemia, unspecified: Secondary | ICD-10-CM | POA: Diagnosis not present

## 2016-03-31 DIAGNOSIS — I2601 Septic pulmonary embolism with acute cor pulmonale: Secondary | ICD-10-CM | POA: Diagnosis not present

## 2016-03-31 DIAGNOSIS — D6489 Other specified anemias: Secondary | ICD-10-CM | POA: Diagnosis present

## 2016-03-31 DIAGNOSIS — R262 Difficulty in walking, not elsewhere classified: Secondary | ICD-10-CM

## 2016-03-31 DIAGNOSIS — M25572 Pain in left ankle and joints of left foot: Secondary | ICD-10-CM

## 2016-03-31 DIAGNOSIS — E878 Other disorders of electrolyte and fluid balance, not elsewhere classified: Secondary | ICD-10-CM

## 2016-03-31 DIAGNOSIS — R600 Localized edema: Secondary | ICD-10-CM

## 2016-03-31 HISTORY — PX: IR GENERIC HISTORICAL: IMG1180011

## 2016-03-31 LAB — CBC WITH DIFFERENTIAL/PLATELET
Basophils Absolute: 0 10*3/uL (ref 0.0–0.1)
Basophils Relative: 0 %
Eosinophils Absolute: 0.1 10*3/uL (ref 0.0–0.7)
Eosinophils Relative: 1 %
HEMATOCRIT: 32.4 % — AB (ref 36.0–46.0)
HEMOGLOBIN: 10.3 g/dL — AB (ref 12.0–15.0)
LYMPHS ABS: 2.1 10*3/uL (ref 0.7–4.0)
LYMPHS PCT: 19 %
MCH: 30.3 pg (ref 26.0–34.0)
MCHC: 31.8 g/dL (ref 30.0–36.0)
MCV: 95.3 fL (ref 78.0–100.0)
MONOS PCT: 4 %
Monocytes Absolute: 0.4 10*3/uL (ref 0.1–1.0)
NEUTROS PCT: 76 %
Neutro Abs: 8.6 10*3/uL — ABNORMAL HIGH (ref 1.7–7.7)
Platelets: 127 10*3/uL — ABNORMAL LOW (ref 150–400)
RBC: 3.4 MIL/uL — AB (ref 3.87–5.11)
RDW: 12.9 % (ref 11.5–15.5)
WBC: 11.1 10*3/uL — AB (ref 4.0–10.5)

## 2016-03-31 LAB — I-STAT TROPONIN, ED: TROPONIN I, POC: 0.35 ng/mL — AB (ref 0.00–0.08)

## 2016-03-31 LAB — BASIC METABOLIC PANEL
Anion gap: 13 (ref 5–15)
BUN: 9 mg/dL (ref 6–20)
CHLORIDE: 106 mmol/L (ref 101–111)
CO2: 20 mmol/L — AB (ref 22–32)
Calcium: 8.1 mg/dL — ABNORMAL LOW (ref 8.9–10.3)
Creatinine, Ser: 1.29 mg/dL — ABNORMAL HIGH (ref 0.44–1.00)
GFR calc Af Amer: >60 mL/min (ref >60)
GFR calc non Af Amer: 54 mL/min — ABNORMAL LOW (ref >60)
GLUCOSE: 193 mg/dL — AB (ref 65–99)
POTASSIUM: 3.8 mmol/L (ref 3.5–5.1)
Sodium: 139 mmol/L (ref 135–145)

## 2016-03-31 LAB — CBC
HEMATOCRIT: 29.2 % — AB (ref 36.0–46.0)
HEMOGLOBIN: 9.5 g/dL — AB (ref 12.0–15.0)
MCH: 30.3 pg (ref 26.0–34.0)
MCHC: 32.5 g/dL (ref 30.0–36.0)
MCV: 93 fL (ref 78.0–100.0)
Platelets: 122 10*3/uL — ABNORMAL LOW (ref 150–400)
RBC: 3.14 MIL/uL — AB (ref 3.87–5.11)
RDW: 13 % (ref 11.5–15.5)
WBC: 12.4 10*3/uL — ABNORMAL HIGH (ref 4.0–10.5)

## 2016-03-31 LAB — GLUCOSE, CAPILLARY: GLUCOSE-CAPILLARY: 101 mg/dL — AB (ref 65–99)

## 2016-03-31 LAB — FIBRINOGEN: Fibrinogen: 307 mg/dL (ref 210–475)

## 2016-03-31 LAB — PROTIME-INR
INR: 1.34
PROTHROMBIN TIME: 16.7 s — AB (ref 11.4–15.2)

## 2016-03-31 LAB — I-STAT BETA HCG BLOOD, ED (MC, WL, AP ONLY): I-stat hCG, quantitative: <5 m[IU]/mL (ref <5)

## 2016-03-31 LAB — I-STAT CG4 LACTIC ACID, ED: Lactic Acid, Venous: 5.74 mmol/L (ref 0.5–1.9)

## 2016-03-31 LAB — LACTIC ACID, PLASMA: Lactic Acid, Venous: 1.6 mmol/L (ref 0.5–1.9)

## 2016-03-31 MED ORDER — SODIUM CHLORIDE 0.9 % IV SOLN
INTRAVENOUS | Status: DC
  Administered 2016-04-01: 09:00:00 via INTRAVENOUS

## 2016-03-31 MED ORDER — SODIUM CHLORIDE 0.9% FLUSH
3.0000 mL | Freq: Two times a day (BID) | INTRAVENOUS | Status: DC
  Administered 2016-04-01 – 2016-04-02 (×2): 3 mL via INTRAVENOUS

## 2016-03-31 MED ORDER — SODIUM CHLORIDE 0.9 % IV SOLN: INTRAVENOUS | Status: DC

## 2016-03-31 MED ORDER — FENTANYL CITRATE (PF) 100 MCG/2ML IJ SOLN
INTRAMUSCULAR | Status: AC | PRN
  Administered 2016-03-31: 25 ug via INTRAVENOUS

## 2016-03-31 MED ORDER — SODIUM CHLORIDE 0.9 % IV BOLUS (SEPSIS)
1000.0000 mL | Freq: Once | INTRAVENOUS | Status: AC
  Administered 2016-03-31: 1000 mL via INTRAVENOUS

## 2016-03-31 MED ORDER — SODIUM CHLORIDE 0.9 % IV SOLN: 250.0000 mL | INTRAVENOUS | Status: DC | PRN

## 2016-03-31 MED ORDER — SODIUM CHLORIDE 0.9% FLUSH: 3.0000 mL | INTRAVENOUS | Status: DC | PRN

## 2016-03-31 MED ORDER — INSULIN ASPART 100 UNIT/ML ~~LOC~~ SOLN: 0.0000 [IU] | SUBCUTANEOUS | Status: DC

## 2016-03-31 MED ORDER — FENTANYL CITRATE (PF) 100 MCG/2ML IJ SOLN
INTRAMUSCULAR | Status: AC
  Filled 2016-03-31: qty 2

## 2016-03-31 MED ORDER — MIDAZOLAM HCL 2 MG/2ML IJ SOLN
INTRAMUSCULAR | Status: AC | PRN
  Administered 2016-03-31 (×2): 0.5 mg via INTRAVENOUS

## 2016-03-31 MED ORDER — HEPARIN BOLUS VIA INFUSION
4500.0000 [IU] | Freq: Once | INTRAVENOUS | Status: AC
  Administered 2016-03-31: 4500 [IU] via INTRAVENOUS
  Filled 2016-03-31: qty 4500

## 2016-03-31 MED ORDER — SODIUM CHLORIDE 0.9 % IV SOLN
12.0000 mg | Freq: Once | INTRAVENOUS | Status: AC
  Administered 2016-03-31: 12 mg via INTRAVENOUS
  Filled 2016-03-31: qty 12

## 2016-03-31 MED ORDER — IOPAMIDOL (ISOVUE-300) INJECTION 61%
INTRAVENOUS | Status: AC
  Administered 2016-03-31: 15 mL
  Filled 2016-03-31: qty 50

## 2016-03-31 MED ORDER — HEPARIN (PORCINE) IN NACL 100-0.45 UNIT/ML-% IJ SOLN
1200.0000 [IU]/h | INTRAMUSCULAR | Status: DC
  Administered 2016-03-31: 1200 [IU]/h via INTRAVENOUS
  Administered 2016-04-01 – 2016-04-02 (×2): 1100 [IU]/h via INTRAVENOUS
  Filled 2016-03-31 (×4): qty 250

## 2016-03-31 MED ORDER — MIDAZOLAM HCL 2 MG/2ML IJ SOLN
INTRAMUSCULAR | Status: AC
  Filled 2016-03-31: qty 2

## 2016-03-31 MED ORDER — SODIUM CHLORIDE 0.9 % IV SOLN
INTRAVENOUS | Status: DC
  Administered 2016-03-31: 125 mL/h via INTRAVENOUS
  Administered 2016-04-01 – 2016-04-03 (×4): via INTRAVENOUS

## 2016-03-31 MED ORDER — LIDOCAINE HCL (PF) 1 % IJ SOLN
INTRAMUSCULAR | Status: AC | PRN
  Administered 2016-03-31: 5 mL

## 2016-03-31 MED ORDER — LIDOCAINE HCL (PF) 1 % IJ SOLN
INTRAMUSCULAR | Status: AC
  Filled 2016-03-31: qty 30

## 2016-03-31 NOTE — H&P (Signed)
PULMONARY / CRITICAL CARE MEDICINE   Name: Samantha Friedman MRN: [id number] DOB: 29-Apr-1980    ADMISSION DATE:  03/31/2016 CONSULTATION DATE:  03/31/16  REFERRING MD:  Lewie Chamber - EDP  CHIEF COMPLAINT:  SOB  HISTORY OF PRESENT ILLNESS:   Samantha Friedman is a 36 y.o. F with PMH of anemia and scoliosis.  She presented to [facility] ED 3/29 with sharp pain between shoulder blades, SOB, diaphoresis, syncopal episode. She was supposedly exercising on a exercise bike when she lost consciousness. On EMS arrival, she was hypotensive and hypoxic.   She was brought to ED with CT of the chest revealed massive PE (RV/LV = 1.6).  She was also in respiratory distress therefore was placed on BiPAP due to increased work of breathing.  PCCM was asked to admit to ICU and consider EKOS.  Given increased work of breathing and troponin bump, IR was consulted and will proceed with EKOS.  Of note, she had ankle surgery in January of this year for ruptured Achilles tendon. She is also on oral contraceptives. She has not had any recent travel, hemoptysis, history of malignancy, prior VTE.  PAST MEDICAL HISTORY :  She  has a past medical history of Anemia and Scoliosis.  PAST SURGICAL HISTORY: She  has a past surgical history that includes Fracture surgery; broken thumb ; and Achilles tendon surgery (Left, 01/14/2016).  Allergies  Allergen Reactions  . Latex Rash    No current facility-administered medications on file prior to encounter.    Current Outpatient Prescriptions on File Prior to Encounter  Medication Sig  . ibuprofen (ADVIL,MOTRIN) 800 MG tablet TAKE 1 TABLET(800 MG) BY MOUTH EVERY 8 HOURS AS NEEDED  . oxyCODONE-acetaminophen (PERCOCET/ROXICET) 5-325 MG tablet Take 1-2 tablets by mouth every 4 (four) hours as needed for severe pain.  Marland Kitchen PREVIDENT 5000 PLUS 1.1 % CREA dental cream Place 1 application onto teeth at bedtime.  . TRINESSA, 28, 0.18/0.215/0.25 MG-35 MCG tablet TAKE 1 TABLET BY MOUTH EVERY DAY     FAMILY HISTORY:  Her indicated that her mother is alive. She indicated that her father is alive. She indicated that her sister is alive. She indicated that her maternal grandmother is alive. She indicated that her maternal grandfather is alive. She indicated that her paternal grandmother is alive. She indicated that her paternal grandfather is deceased.    SOCIAL HISTORY: She  reports that she has never smoked. She has never used smokeless tobacco. She reports that she does not drink alcohol or use drugs.  REVIEW OF SYSTEMS:   All negative; except for those that are bolded, which indicate positives.  Constitutional: weight loss, weight gain, night sweats, fevers, chills, fatigue, weakness.  HEENT: headaches, sore throat, sneezing, nasal congestion, post nasal drip, difficulty swallowing, tooth/dental problems, visual complaints, visual changes, ear aches. Neuro: difficulty with speech, weakness, numbness, ataxia. CV:  chest pain, orthopnea, PND, swelling in lower extremities, dizziness, palpitations, syncope.  Resp: cough, hemoptysis, dyspnea, wheezing. GI: heartburn, indigestion, abdominal pain, nausea, vomiting, diarrhea, constipation, change in bowel habits, loss of appetite, hematemesis, melena, hematochezia.  GU: dysuria, change in color of urine, urgency or frequency, flank pain, hematuria. MSK: joint pain or swelling, decreased range of motion. Psych: change in mood or affect, depression, anxiety, suicidal ideations, homicidal ideations. Skin: rash, itching, bruising.   SUBJECTIVE:  Remains on BIPAP. States that dyspnea has somewhat improved however feels like it is still hard to breath   VITAL SIGNS: BP 106/65   Resp (!) 26   Ht  5\' 11"  (1.803 m)   Wt 71.7 kg (158 lb 1.1 oz)   BMI 22.05 kg/m   HEMODYNAMICS:    VENTILATOR SETTINGS: Vent Mode: BIPAP FiO2 (%):  [100 %] 100 % Set Rate:  [10 bmp] 10 bmp  INTAKE / OUTPUT: No intake/output data recorded.  PHYSICAL  EXAMINATION: General:  Adult female, moderate respiratory distress  Neuro:  Alert, follows commands, grossly intact  HEENT:  Normocephalic  Cardiovascular:  Tachy, no MRG, NI S1/S2  Lungs: Diminished breath sounds, tachypnea  Abdomen:  Non-tender, non-distended, active bowel sounds  Musculoskeletal:  No acute  Skin:  Warm, dry, intact   LABS:  BMET  Recent Labs Lab 03/31/16 1828  NA 139  K 3.8  CL 106  CO2 20*  BUN 9  CREATININE 1.29*  GLUCOSE 193*    Electrolytes  Recent Labs Lab 03/31/16 1828  CALCIUM 8.1*    CBC  Recent Labs Lab 03/31/16 1828  WBC 11.1*  HGB 10.3*  HCT 32.4*  PLT 127*    Coag's  Recent Labs Lab 03/31/16 1828  INR 1.34    Sepsis Markers  Recent Labs Lab 03/31/16 1834  LATICACIDVEN 5.74*    ABG No results for input(s): PHART, PCO2ART, PO2ART in the last 168 hours.  Liver Enzymes No results for input(s): AST, ALT, ALKPHOS, BILITOT, ALBUMIN in the last 168 hours.  Cardiac Enzymes No results for input(s): TROPONINI, PROBNP in the last 168 hours.  Glucose No results for input(s): GLUCAP in the last 168 hours.  Imaging Ct Angio Chest Pe W And/or Wo Contrast  Result Date: 03/31/2016 CLINICAL DATA:  Hypoxia, hypotension and syncope. Chest pain. No cough or fever. On birth control. Recent surgery 01/14/2016. EXAM: CT ANGIOGRAPHY CHEST WITH CONTRAST TECHNIQUE: Multidetector CT imaging of the chest was performed using the standard protocol during bolus administration of intravenous contrast. Multiplanar CT image reconstructions and MIPs were obtained to evaluate the vascular anatomy. CONTRAST:  100 cc Isovue 370 IV COMPARISON:  None. FINDINGS: Cardiovascular: Acute bilateral multilobar pulmonary emboli with the RV/LV ratio 1.6. Near saddle embolus with most of the thrombus seen in the right main pulmonary artery. Cardiac chambers are normal in size. No pericardial effusion is identified. No aortic aneurysm or dissection.  Mediastinum/Nodes: No enlarged mediastinal, hilar, or axillary lymph nodes. Thyroid gland, trachea, and esophagus demonstrate no significant findings. Lungs/Pleura: Parenchymal opacities in the periphery of the right lower lobe more likely represent pulmonary infarcts as opposed pneumonic consolidations given the clinical report of no fever and cough. Upper Abdomen: No acute abnormality. Musculoskeletal: Small cutaneous nodules along anterior lower chest wall. No acute or significant osseous findings. Review of the MIP images confirms the above findings. IMPRESSION: Positive for acute PE with CT evidence of right heart strain (RV/LV Ratio = 1.6) consistent with at least submassive (intermediate risk) PE. The presence of right heart strain has been associated with an increased risk of morbidity and mortality. Please activate Code PE by paging [phone]. Critical Value/emergent results were called by telephone at the time of interpretation on 03/31/2016 at 6:37 pm to Dr. Lynden Oxford , who verbally acknowledged these results. Peripheral right lower lobe pulmonary opacities consistent with pulmonary infarct as opposed pneumonic consolidations given lack of clinical symptoms for pneumonia after discussion with Dr. Rush Landmark. Cutaneous nodules of the anterior chest wall overlying the lower sternum. Electronically Signed   By: Tollie Eth M.D.   On: 03/31/2016 18:39   Dg Chest Portable 1 View  Result Date: 03/31/2016 CLINICAL DATA:  Shortness of breath EXAM: PORTABLE CHEST 1 VIEW COMPARISON:  Portable exam 1731 hours without priors for comparison FINDINGS: Normal heart size, mediastinal contours, and pulmonary vascularity. Infiltrate identified at the lateral RIGHT lung base consistent with pneumonia. Remaining lungs clear. No pleural effusion or pneumothorax. Bones unremarkable. IMPRESSION: RIGHT basilar infiltrate consistent with pneumonia. Electronically Signed   By: Ulyses SouthwardMark  Boles M.D.   On: 03/31/2016 17:46      STUDIES:  CTA Chest 3/29 > acute bilateral PE (RV / LV = 1.6) Echo 3/30 >  LE Duplex 3/30 >   CULTURES: None.  ANTIBIOTICS: None.  SIGNIFICANT EVENTS: 3/29 > admit.   DISCUSSION: 36 y.o. F admitted 3/29 with acute bilateral PE.  Had ankle surgery in January and is on OCP's. Started on heparin and IR called for EKOS.  ASSESSMENT / PLAN:  Acute bilateral PE - appears provoked given that the patient is on oral contraceptives. Also had recent ankle surgery in January of this year. Troponin bump - due to above. Hypotension - due to above.  Resolving with IVF's. Plan: Continue heparin drip. IR consulted, will proceed with EKOS tonight. Trend troponin. Assess echo, LE duplex. Continue IVF resuscitation.  AKI. Elevated lactic acid - presumed due to increased work of breathing along with hypotension. Plan: Continue IVF's. Trend lactate. BMP in AM.  Hyperglycemia - no known history of DM. Plan:  SSI. Assess Hgb A1c.  Best Practice: Code Status:  Full Code. Diet:  NPO for now. VTE prophylaxis:  Heparin only.   FAMILY  Updates: Friend updated at bedside.  Mother live in [place] and will come to hospital in next few days.  Inter-disciplinary family meet or Palliative Care meeting due by:  04/06/16.   Rutherford Guysahul Desai, [place] [facility] Pager: [phone]  or [phone] 03/31/2016, 7:36 PM   STAFF NOTE: I, Rory Percyaniel Deniyah Dillavou, MD FACP have personally reviewed patient's available data, including medical history, events of note, physical examination and test results as part of my evaluation. I have discussed with resident/NP and other care providers such as pharmacist, RN and RRT. In addition, I personally evaluated patient and elicited key findings of: awake, alert, anxious, just now post EKOS, lungs clear, no rt heave, abdo soft, left lower ext about same size rt but maybe some increased edema present, CT reviewed significant  bilateral clot burden and lactic 5, trop pos, ratio 1.6, decision was made for EKOS and now has catheters with tpa infusion, need US legs, echo for pa pressures and rv function, needs congenital work up that is accurate now ( [place] [address] gene mutation, homocysteine, factor 5) and at3, prot c, prot s in 6 weeks on therapy oral, she was on OCT and have explained that she can no longer ever take, I guess her januatry procedure archils could have contributed slight to risk, if she dclines in ANY way, would dc drips ekos and give systmeic tpa, she has NO risk factors ICH, her volumes on NIMV are excellent, on min O2, will consider dc NIMV to O2 and observe her WOB The patient is critically ill with multiple organ systems failure and requires high complexity decision making for assessment and support, frequent evaluation and titration of therapies, application of advanced monitoring technologies and extensive interpretation of multiple databases.   Critical Care Time devoted to patient care services described in this note is 35 Minutes. This time reflects time of care of this signee: Rory Percyaniel Kathrynne Kulinski, MD  FACP. This critical care time does not reflect procedure time, or teaching time or supervisory time of PA/NP/Med student/Med Resident etc but could involve care discussion time. Rest per NP/medical resident whose note is outlined above and that I agree with   Mcarthur Rossetti. Tyson Alias, MD, FACP Pgr: [phone] [facility] 03/31/2016 9:11 PM

## 2016-03-31 NOTE — Sedation Documentation (Signed)
Patient denies pain and is resting comfortably.  

## 2016-03-31 NOTE — ED Notes (Signed)
Cardiology at the bedside.

## 2016-03-31 NOTE — Progress Notes (Signed)
Patient was transported from Trauma C in ER to IR for procedure on BiPAP.   ER RT gave IR staff number to RT charge for transport after procedure. Transport was completed with no complication.

## 2016-03-31 NOTE — ED Notes (Signed)
With pts permission, spoke with pts mother, Myrene BuddyYvonne, via phone. Pt's mother is in [place] and states she is 4 hrs away. Pt transported to CT Scan on Bi-pap, with RN, RT, and EMT.

## 2016-03-31 NOTE — Consult Note (Signed)
Chief Complaint: Patient was seen in consultation today for  Chief Complaint  Patient presents with  . Loss of Consciousness  . Shortness of Breath   at the request of Rutherford Guys, MD  Referring Physician(s): Dr. Rutherford Guys   Patient Status: [facility] - ED  History of Present Illness: Samantha Friedman is a 36 y.o. female who presented to [facility] ED today (3/29) with with complaints of sharp pain between shoulder blades, SOB, diaphoresis, syncopal episode. She was exercising on a exercise bike when she lost consciousness. On EMS arrival, she was hypotensive and hypoxic.   CTA of the chest revealed large volume bilateral PE with evidence of right heart strain (RV/LV = 1.6) consistent with at least submassive (moderate risk). She was also hypotensive and in respiratory distress consistent with massive (high risk) PE.  She is currently on BiPAP at 100% O2.  Positive troponin elevation c/w right heart strain.   Of note, she had Achilles tendon repair surgery surgery in January of this year and has been rehabbing since that time. She is also on oral contraceptives. She has not had any recent travel, hemoptysis, history of malignancy, prior VTE and she doe snot smoke or use smokeless tobacco.   She remains dyspneic and cannot speak in full sentences despite BiPAP.  Past Medical History:  Diagnosis Date  . Anemia   . Scoliosis     Past Surgical History:  Procedure Laterality Date  . ACHILLES TENDON SURGERY Left 01/14/2016   Procedure: DIRECT PRIMARY REPAIR LEFT ACHILLES TENDON;  Surgeon: Kathryne Hitch, MD;  Location: [facility];  Service: Orthopedics;  Laterality: Left;  . broken thumb     . FRACTURE SURGERY      Allergies: Latex  Medications: Prior to Admission medications   Medication Sig Start Date End Date Taking? Authorizing Provider  ibuprofen (ADVIL,MOTRIN) 800 MG tablet TAKE 1 TABLET(800 MG) BY MOUTH EVERY 8 HOURS AS NEEDED Patient taking differently: Take 800 mg by  mouth every 8 hours as needed for pain 03/21/16  Yes Kathryne Hitch, MD  oxyCODONE-acetaminophen (PERCOCET/ROXICET) 5-325 MG tablet Take 1-2 tablets by mouth every 4 (four) hours as needed for severe pain. 01/14/16  Yes Kathryne Hitch, MD  TRINESSA, 28, 0.18/0.215/0.25 MG-35 MCG tablet TAKE 1 TABLET BY MOUTH EVERY DAY 01/15/16  Yes Gwenlyn Found Copland, MD  PREVIDENT 5000 PLUS 1.1 % CREA dental cream Place 1 application onto teeth at bedtime. 10/26/15   Historical Provider, MD     Family History  Problem Relation Age of Onset  . Diabetes Father   . Cancer Maternal Grandfather     Social History   Social History  . Marital status: Single    Spouse name: N/A  . Number of children: N/A  . Years of education: N/A   Social History Main Topics  . Smoking status: Never Smoker  . Smokeless tobacco: Never Used  . Alcohol use No  . Drug use: No  . Sexual activity: Yes   Other Topics Concern  . None   Social History Narrative  . None    Review of Systems: A 12 point ROS discussed and pertinent positives are indicated in the HPI above.  All other systems are negative.  Review of Systems  Vital Signs: BP 106/65   Temp 98.6 F (37 C)   Resp (!) 26   Ht 5\' 11"  (1.803 m)   Wt 158 lb 1.1 oz (71.7 kg)   BMI 22.05 kg/m   Physical  Exam  Mallampati Score:  MD Evaluation Airway: WNL Heart: Other (comments) Heart  comments: sinus tachycardia Abdomen: WNL Chest/ Lungs: WNL ASA  Classification: 3 Mallampati/Airway Score: One  Imaging: Ct Angio Chest Pe W And/or Wo Contrast  Result Date: 03/31/2016 CLINICAL DATA:  Hypoxia, hypotension and syncope. Chest pain. No cough or fever. On birth control. Recent surgery 01/14/2016. EXAM: CT ANGIOGRAPHY CHEST WITH CONTRAST TECHNIQUE: Multidetector CT imaging of the chest was performed using the standard protocol during bolus administration of intravenous contrast. Multiplanar CT image reconstructions and MIPs were obtained to  evaluate the vascular anatomy. CONTRAST:  100 cc Isovue 370 IV COMPARISON:  None. FINDINGS: Cardiovascular: Acute bilateral multilobar pulmonary emboli with the RV/LV ratio 1.6. Near saddle embolus with most of the thrombus seen in the right main pulmonary artery. Cardiac chambers are normal in size. No pericardial effusion is identified. No aortic aneurysm or dissection. Mediastinum/Nodes: No enlarged mediastinal, hilar, or axillary lymph nodes. Thyroid gland, trachea, and esophagus demonstrate no significant findings. Lungs/Pleura: Parenchymal opacities in the periphery of the right lower lobe more likely represent pulmonary infarcts as opposed pneumonic consolidations given the clinical report of no fever and cough. Upper Abdomen: No acute abnormality. Musculoskeletal: Small cutaneous nodules along anterior lower chest wall. No acute or significant osseous findings. Review of the MIP images confirms the above findings. IMPRESSION: Positive for acute PE with CT evidence of right heart strain (RV/LV Ratio = 1.6) consistent with at least submassive (intermediate risk) PE. The presence of right heart strain has been associated with an increased risk of morbidity and mortality. Please activate Code PE by paging [phone]. Critical Value/emergent results were called by telephone at the time of interpretation on 03/31/2016 at 6:37 pm to Dr. Lynden Oxford , who verbally acknowledged these results. Peripheral right lower lobe pulmonary opacities consistent with pulmonary infarct as opposed pneumonic consolidations given lack of clinical symptoms for pneumonia after discussion with Dr. Rush Landmark. Cutaneous nodules of the anterior chest wall overlying the lower sternum. Electronically Signed   By: Tollie Eth M.D.   On: 03/31/2016 18:39   Dg Chest Portable 1 View  Result Date: 03/31/2016 CLINICAL DATA:  Shortness of breath EXAM: PORTABLE CHEST 1 VIEW COMPARISON:  Portable exam 1731 hours without priors for  comparison FINDINGS: Normal heart size, mediastinal contours, and pulmonary vascularity. Infiltrate identified at the lateral RIGHT lung base consistent with pneumonia. Remaining lungs clear. No pleural effusion or pneumothorax. Bones unremarkable. IMPRESSION: RIGHT basilar infiltrate consistent with pneumonia. Electronically Signed   By: Ulyses Southward M.D.   On: 03/31/2016 17:46    Labs:  CBC:  Recent Labs  01/14/16 0735 03/31/16 1828  WBC 9.8 11.1*  HGB 12.1 10.3*  HCT 35.4* 32.4*  PLT 231 127*    COAGS:  Recent Labs  03/31/16 1828  INR 1.34    BMP:  Recent Labs  03/31/16 1828  NA 139  K 3.8  CL 106  CO2 20*  GLUCOSE 193*  BUN 9  CALCIUM 8.1*  CREATININE 1.29*  GFRNONAA 54*  GFRAA >60    LIVER FUNCTION TESTS: No results for input(s): BILITOT, AST, ALT, ALKPHOS, PROT, ALBUMIN in the last 8760 hours.  TUMOR MARKERS: No results for input(s): AFPTM, CEA, CA199, CHROMGRNA in the last 8760 hours.  Assessment and Plan:  36 yo female with submassive PE and acute cor pulmonale.  She is significantly dyspneic and unable to speak in complete sentences despite BiPAP at 100% O2.   She has no contraindications for thrombolysis  and is an excellent candidate for USAT with EKOS.   I discussed the risks, benefits and alternatives at length with both her and her husband.  I also spoke to her mother over the phone.  She understands and desires to proceed.   1.) Heparinize 2.) To IR for USAT with EKOS 3.) Admit to 2MW  Thank you for this interesting consult.  I greatly enjoyed meeting Rosendo GrosShanika Elbert and look forward to participating in their care.  A copy of this report was sent to the requesting provider on this date.  Electronically Signed: Malachy MoanMCCULLOUGH, Muriah Harsha 03/31/2016, 7:42 PM   I spent a total of 20 Minutes  in face to face in clinical consultation, greater than 50% of which was counseling/coordinating care for acute PE with cor pulmonale.

## 2016-03-31 NOTE — Telephone Encounter (Signed)
Shavon Eamp (emergency contact) was informed patient was sent to Emergency room.  Liz BeachKaren Harris PTA

## 2016-03-31 NOTE — ED Triage Notes (Addendum)
Pt via EMS with sharp pain between shoulder blades, SOB, diaphoresis, and syncopal episode. Per EMS, pt had ankle surgery 4 weeks ago, at PT today pt exercising on bike when she lost consciousness. Pt cool, clammy, and diaphoretic. BP 84 systolic, 84% on RA. Pt with retractions, accessory muscle use, tachypnea. EDP at bedside.

## 2016-03-31 NOTE — Progress Notes (Signed)
Bedside handoff communication given at 2200 to Roselie AwkwardMegan Grissom, RN.

## 2016-03-31 NOTE — Therapy (Addendum)
[facility] [address] [place], [name], [address] Phone: [phone]   Fax:  [phone]  Physical Therapy Treatment  Patient Details  Name: Ashlin Kreps MRN: [id number] Date of Birth: 1980/02/20 Referring Provider: C. Magnus Ivan, MD  Encounter Date: 03/31/2016    Past Medical History:  Diagnosis Date  . Anemia   . Scoliosis     Past Surgical History:  Procedure Laterality Date  . ACHILLES TENDON SURGERY Left 01/14/2016   Procedure: DIRECT PRIMARY REPAIR LEFT ACHILLES TENDON;  Surgeon: Kathryne Hitch, MD;  Location: [facility];  Service: Orthopedics;  Laterality: Left;  . broken thumb     . FRACTURE SURGERY      There were no vitals filed for this visit.      Gait training with single crutch and heel wedge 40 feet.  Emphasis on weightbearing. Recumbent bike. Level 3 for less than 3 minutes when patient became unresponsive.   911 was called and she was transported to the ED.                           PT Short Term Goals - 03/22/16 1753      PT SHORT TERM GOAL #1   Title Pt will demo proper heel toe gait pattern for at least 50 ft without use of AD by 3/30   Baseline unable to do heel toe gait.  Uses crutches   Time 4   Period Weeks   Status On-going     PT SHORT TERM GOAL #2   Title DF ROM to at least 5 deg passively    Baseline 0   Time 4   Period Weeks   Status On-going           PT Long Term Goals - 03/01/16 1727      PT LONG TERM GOAL #1   Title FOTO to 66% ability to indicate significant improvement in functional ability by 4/27   Baseline 42% ability   Time 8   Period Weeks   Status New     PT LONG TERM GOAL #2   Title DF ROM to at least 10 deg for appropriate ROM for gait pattern   Baseline -12 at eval   Time 12   Period Weeks   Status New     PT LONG TERM GOAL #3   Title Pt will demo 5/5 MMT for all hip and ankle MMT to indicate appropraite support to  joints by surrounding musculature   Baseline not tested at eval   Time 8   Period Weeks   Status New     PT LONG TERM GOAL #4   Title Pt will demo ability to jog lighly in order to return to PLOF and prior exercise program, ankle pain <=2/10   Baseline unable at eval   Time 8   Period Weeks   Status New               Plan - 03/31/16 1854    Clinical Impression Statement Session short due to change in medical status.   PT Next Visit Plan See what ER said      Patient will benefit from skilled therapeutic intervention in order to improve the following deficits and impairments:  Abnormal gait, Decreased range of motion, Difficulty walking, Increased fascial restricitons, Decreased activity tolerance, Pain, Impaired flexibility, Decreased scar mobility, Decreased balance, Decreased strength  Visit Diagnosis: Pain in left ankle and  joints of left foot  Stiffness of left ankle, not elsewhere classified  Localized edema  Difficulty in walking, not elsewhere classified     Problem List Patient Active Problem List   Diagnosis Date Noted  . Pulmonary embolism (HCC) 03/31/2016  . Rupture of left Achilles tendon 01/14/2016    HARRIS,KAREN PTA 03/31/2016, 6:55 PM  [facility] [address] [place], [place], [address] Phone: [phone]   Fax:  [phone]  Name: Rosendo GrosShanika Rockman MRN: [id number] Date of Birth: 11/01/1980

## 2016-03-31 NOTE — ED Provider Notes (Signed)
[facility] DEPT Provider Note   CSN: [id number] Arrival date & time: 03/31/16  1718     History   Chief Complaint Chief Complaint  Patient presents with  . Loss of Consciousness  . Shortness of Breath    HPI Samantha Friedman is a 36 y.o. female.  HPI  36 y/o female 9 weeks s/p repair of a left Achilles tendon rupture who presents in respiratory distress. Patient states that she was at physical therapy when she became lightheaded, acutely developed shortness of breath, and passed out. She is not on blood thinners after her surgery. She does take estrogen oral contraceptives. Denies smoking. No history of asthma, heart disease, or lung disease. Denies other medical conditions. No history of DVT, PE, or clotting disorder. Denies chest pain. Endorses profound dyspnea that persists. Currently denies lightheadedness. Dyspnea is worsened with any exertion. No other aggravating or relieving factors or associated symptoms.  Past Medical History:  Diagnosis Date  . Anemia   . Scoliosis     Patient Active Problem List   Diagnosis Date Noted  . Pulmonary embolism (HCC) 03/31/2016  . Rupture of left Achilles tendon 01/14/2016    Past Surgical History:  Procedure Laterality Date  . ACHILLES TENDON SURGERY Left 01/14/2016   Procedure: DIRECT PRIMARY REPAIR LEFT ACHILLES TENDON;  Surgeon: Kathryne Hitch, MD;  Location: [facility];  Service: Orthopedics;  Laterality: Left;  . broken thumb     . FRACTURE SURGERY    . IR GENERIC HISTORICAL  03/31/2016   IR INFUSION THROMBOL ARTERIAL INITIAL (MS) 03/31/2016 Malachy Moan, MD [facility]  . IR GENERIC HISTORICAL  03/31/2016   IR ANGIOGRAM SELECTIVE EACH ADDITIONAL VESSEL 03/31/2016 Malachy Moan, MD [facility]  . IR GENERIC HISTORICAL  03/31/2016   IR ANGIOGRAM SELECTIVE EACH ADDITIONAL VESSEL 03/31/2016 Malachy Moan, MD [facility]  . IR GENERIC HISTORICAL  03/31/2016   IR ANGIOGRAM PULMONARY BILATERAL SELECTIVE 03/31/2016  Malachy Moan, MD [facility]  . IR GENERIC HISTORICAL  03/31/2016   IR US GUIDE VASC ACCESS RIGHT 03/31/2016 Malachy Moan, MD [facility]  . IR GENERIC HISTORICAL  03/31/2016   IR INFUSION THROMBOL ARTERIAL INITIAL (MS) 03/31/2016 Malachy Moan, MD [facility]    OB History    No data available       Home Medications    Prior to Admission medications   Medication Sig Start Date End Date Taking? Authorizing Provider  ibuprofen (ADVIL,MOTRIN) 800 MG tablet TAKE 1 TABLET(800 MG) BY MOUTH EVERY 8 HOURS AS NEEDED Patient taking differently: Take 800 mg by mouth every 8 hours as needed for pain 03/21/16  Yes Kathryne Hitch, MD  oxyCODONE-acetaminophen (PERCOCET/ROXICET) 5-325 MG tablet Take 1-2 tablets by mouth every 4 (four) hours as needed for severe pain. 01/14/16  Yes Kathryne Hitch, MD  TRINESSA, 28, 0.18/0.215/0.25 MG-35 MCG tablet TAKE 1 TABLET BY MOUTH EVERY DAY 01/15/16  Yes Gwenlyn Found Copland, MD  PREVIDENT 5000 PLUS 1.1 % CREA dental cream Place 1 application onto teeth at bedtime. 10/26/15   Historical Provider, MD    Family History Family History  Problem Relation Age of Onset  . Diabetes Father   . Cancer Maternal Grandfather     Social History Social History  Substance Use Topics  . Smoking status: Never Smoker  . Smokeless tobacco: Never Used  . Alcohol use No     Allergies   Latex   Review of Systems Review of Systems  Constitutional: Negative for chills and fever.  HENT: Negative for congestion.   Eyes: Negative for visual disturbance.  Respiratory: Positive for shortness of breath. Negative for cough and wheezing.   Cardiovascular: Negative for chest pain and palpitations.  Gastrointestinal: Negative for abdominal pain, nausea and vomiting.  Genitourinary: Negative for dysuria and frequency.  Musculoskeletal: Negative for arthralgias, back pain and neck pain.  Skin: Negative for rash.  Neurological: Positive for syncope and  light-headedness. Negative for headaches.     Physical Exam Updated Vital Signs BP 115/77   Pulse (!) 117   Temp 98.3 F (36.8 C) (Oral)   Resp 18   Ht 5\' 11"  (1.803 m)   Wt 71.7 kg   SpO2 100%   BMI 22.05 kg/m   Physical Exam  Constitutional: She is oriented to person, place, and time. She appears distressed.  HENT:  Head: Normocephalic and atraumatic.  Eyes: Conjunctivae are normal.  Neck: Neck supple.  Cardiovascular: Regular rhythm.   No murmur heard. Tachycardic to 140 bpm  Pulmonary/Chest: She is in respiratory distress. She has no wheezes. She has no rales.  tachypneic, increased WOB.   Abdominal: Soft. Bowel sounds are normal. She exhibits no distension. There is no tenderness.  Musculoskeletal: She exhibits no edema.  Neurological: She is alert and oriented to person, place, and time. She exhibits normal muscle tone.  Skin: Skin is warm and dry.  Psychiatric:  anxious  Nursing note and vitals reviewed.    ED Treatments / Results  Labs (all labs ordered are listed, but only abnormal results are displayed) Labs Reviewed  CBC WITH DIFFERENTIAL/PLATELET - Abnormal; Notable for the following:       Result Value   WBC 11.1 (*)    RBC 3.40 (*)    Hemoglobin 10.3 (*)    HCT 32.4 (*)    Platelets 127 (*)    Neutro Abs 8.6 (*)    All other components within normal limits  BASIC METABOLIC PANEL - Abnormal; Notable for the following:    CO2 20 (*)    Glucose, Bld 193 (*)    Creatinine, Ser 1.29 (*)    Calcium 8.1 (*)    GFR calc non Af Amer 54 (*)    All other components within normal limits  PROTIME-INR - Abnormal; Notable for the following:    Prothrombin Time 16.7 (*)    All other components within normal limits  CBC - Abnormal; Notable for the following:    WBC 12.4 (*)    RBC 3.14 (*)    Hemoglobin 9.5 (*)    HCT 29.2 (*)    Platelets 122 (*)    All other components within normal limits  GLUCOSE, CAPILLARY - Abnormal; Notable for the following:      Glucose-Capillary 101 (*)    All other components within normal limits  I-STAT CG4 LACTIC ACID, ED - Abnormal; Notable for the following:    Lactic Acid, Venous 5.74 (*)    All other components within normal limits  I-STAT TROPOININ, ED - Abnormal; Notable for the following:    Troponin i, poc 0.35 (*)    All other components within normal limits  MRSA PCR SCREENING  LACTIC ACID, PLASMA  FIBRINOGEN  HIV ANTIBODY (ROUTINE TESTING)  BASIC METABOLIC PANEL  MAGNESIUM  PHOSPHORUS  LACTIC ACID, PLASMA  HEMOGLOBIN A1C  HOMOCYSTEINE  PROTHROMBIN GENE MUTATION  FACTOR 5 LEIDEN  CBC  CBC  FIBRINOGEN  FIBRINOGEN  HEPARIN LEVEL (UNFRACTIONATED)  HEPARIN LEVEL (UNFRACTIONATED)  CBC  FIBRINOGEN  HEPARIN LEVEL (  UNFRACTIONATED)  HEPARIN LEVEL (UNFRACTIONATED)  I-STAT BETA HCG BLOOD, ED ([facility], [facility], [facility] ONLY)    EKG  EKG Interpretation None       Radiology Ct Angio Chest Pe W And/or Wo Contrast  Result Date: 03/31/2016 CLINICAL DATA:  Hypoxia, hypotension and syncope. Chest pain. No cough or fever. On birth control. Recent surgery 01/14/2016. EXAM: CT ANGIOGRAPHY CHEST WITH CONTRAST TECHNIQUE: Multidetector CT imaging of the chest was performed using the standard protocol during bolus administration of intravenous contrast. Multiplanar CT image reconstructions and MIPs were obtained to evaluate the vascular anatomy. CONTRAST:  100 cc Isovue 370 IV COMPARISON:  None. FINDINGS: Cardiovascular: Acute bilateral multilobar pulmonary emboli with the RV/LV ratio 1.6. Near saddle embolus with most of the thrombus seen in the right main pulmonary artery. Cardiac chambers are normal in size. No pericardial effusion is identified. No aortic aneurysm or dissection. Mediastinum/Nodes: No enlarged mediastinal, hilar, or axillary lymph nodes. Thyroid gland, trachea, and esophagus demonstrate no significant findings. Lungs/Pleura: Parenchymal opacities in the periphery of the right lower lobe more likely  represent pulmonary infarcts as opposed pneumonic consolidations given the clinical report of no fever and cough. Upper Abdomen: No acute abnormality. Musculoskeletal: Small cutaneous nodules along anterior lower chest wall. No acute or significant osseous findings. Review of the MIP images confirms the above findings. IMPRESSION: Positive for acute PE with CT evidence of right heart strain (RV/LV Ratio = 1.6) consistent with at least submassive (intermediate risk) PE. The presence of right heart strain has been associated with an increased risk of morbidity and mortality. Please activate Code PE by paging [phone]. Critical Value/emergent results were called by telephone at the time of interpretation on 03/31/2016 at 6:37 pm to Dr. Lynden Oxford , who verbally acknowledged these results. Peripheral right lower lobe pulmonary opacities consistent with pulmonary infarct as opposed pneumonic consolidations given lack of clinical symptoms for pneumonia after discussion with Dr. Rush Landmark. Cutaneous nodules of the anterior chest wall overlying the lower sternum. Electronically Signed   By: Tollie Eth M.D.   On: 03/31/2016 18:39   Ir Angiogram Pulmonary Bilateral Selective  Result Date: 03/31/2016 INDICATION: 36 year old female with acute large volume bilateral pulmonary embolus and acute cor pulmonale. EXAM: IR INFUSION THROMBOL ARTERIAL INITIAL (MS); ADDITIONAL ARTERIOGRAPHY; IR ULTRASOUND GUIDANCE VASC ACCESS RIGHT; BILATERAL PULMONARY ARTERIOGRAPHY COMPARISON:  CTA chest 03/31/2016 MEDICATIONS: None. ANESTHESIA/SEDATION: Versed 1 mg IV; Fentanyl 25 mcg IV Moderate Sedation Time:  30 minutes The patient was continuously monitored during the procedure by the interventional radiology nurse under my direct supervision. FLUOROSCOPY TIME:  Fluoroscopy Time: 14 minutes 0 seconds (40 mGy). COMPLICATIONS: None immediate. TECHNIQUE: Informed written consent was obtained from the patient after a thorough discussion  of the procedural risks, benefits and alternatives. All questions were addressed. Maximal Sterile Barrier Technique was utilized including caps, mask, sterile gowns, sterile gloves, sterile drape, hand hygiene and skin antiseptic. A timeout was performed prior to the initiation of the procedure. The right common femoral vein was interrogated with ultrasound and found to be widely patent. An image was obtained and stored for the medical record. Local anesthesia was attained by infiltration with 1% lidocaine. A small dermatotomy was made. Under real-time sonographic guidance, the vessel was punctured with a 21 gauge micropuncture needle. Using standard technique, the initial micro needle was exchanged over a 0.018 micro wire for a transitional 4 [place] micro sheath. The micro sheath was then exchanged over a 0.035 wire for a 6 French sheath. Using the same technique,  a second right common femoral venous access was attained under ultrasound guidance and a second 6 [place] vascular sheath placed. A C2 cobra catheter was advanced over a Bentson wire through the more lateral sheath and advanced into the main pulmonary artery. A bilateral pulmonary arteriogram was performed. There is significant occlusive thrombus in the right main pulmonary artery extending into the right lower lobe pulmonary artery. Subocclusive thrombus is also present in the right upper lobar pulmonary artery. There is occlusive thrombus in the left lower lobe pulmonary artery. Pulmonary arterial pressures were then obtained. The main pulmonary arterial pressure is 41/22 with a mean of 30 mm Hg. The C2 cobra catheter was then exchanged over a Rosen wire for a 100 cm vertebral catheter. Using a glidewire, the catheter was successfully navigated beyond the occlusive thrombus in the right lower lobe pulmonary artery. The Glidewire was then exchanged for a Rosen wire and a 12 cm EKOS infusion catheter advanced over the wire. The catheter was flushed. The C2  cobra catheter was then advanced over a Bentson wire through the more medial sheath in used to select the left pulmonary artery. In the catheter was advanced beyond the thrombus in the left lower lobe pulmonary artery. A Rosen wire was placed. The cobra catheter was removed and a 12 cm EKOS infusion catheter was advanced over the wire. The 6 French sheaths were secured to the skin with 2-0 Prolene. Arterial thrombolysis was initiated with 1 milligram/hour tPA per catheter. FINDINGS: Pulmonary arterial hypertension. The main pulmonary arterial pressure is 41/22 with a mean of 30 mm Hg IMPRESSION: Successful initiation of bilateral catheter directed ultrasound assisted thrombolysis with EKOS. Signed, Sterling Big, MD [facility] Radiology Electronically Signed   By: Malachy Moan M.D.   On: 03/31/2016 20:59   Ir Angiogram Selective Each Additional Vessel  Result Date: 03/31/2016 INDICATION: 36 year old female with acute large volume bilateral pulmonary embolus and acute cor pulmonale. EXAM: IR INFUSION THROMBOL ARTERIAL INITIAL (MS); ADDITIONAL ARTERIOGRAPHY; IR ULTRASOUND GUIDANCE VASC ACCESS RIGHT; BILATERAL PULMONARY ARTERIOGRAPHY COMPARISON:  CTA chest 03/31/2016 MEDICATIONS: None. ANESTHESIA/SEDATION: Versed 1 mg IV; Fentanyl 25 mcg IV Moderate Sedation Time:  30 minutes The patient was continuously monitored during the procedure by the interventional radiology nurse under my direct supervision. FLUOROSCOPY TIME:  Fluoroscopy Time: 14 minutes 0 seconds (40 mGy). COMPLICATIONS: None immediate. TECHNIQUE: Informed written consent was obtained from the patient after a thorough discussion of the procedural risks, benefits and alternatives. All questions were addressed. Maximal Sterile Barrier Technique was utilized including caps, mask, sterile gowns, sterile gloves, sterile drape, hand hygiene and skin antiseptic. A timeout was performed prior to the  initiation of the procedure. The right common femoral vein was interrogated with ultrasound and found to be widely patent. An image was obtained and stored for the medical record. Local anesthesia was attained by infiltration with 1% lidocaine. A small dermatotomy was made. Under real-time sonographic guidance, the vessel was punctured with a 21 gauge micropuncture needle. Using standard technique, the initial micro needle was exchanged over a 0.018 micro wire for a transitional 4 [place] micro sheath. The micro sheath was then exchanged over a 0.035 wire for a 6 French sheath. Using the same technique, a second right common femoral venous access was attained under ultrasound guidance and a second 6 [place] vascular sheath placed. A C2 cobra catheter was advanced over a Bentson wire through the more lateral sheath and advanced into the main pulmonary artery. A bilateral  pulmonary arteriogram was performed. There is significant occlusive thrombus in the right main pulmonary artery extending into the right lower lobe pulmonary artery. Subocclusive thrombus is also present in the right upper lobar pulmonary artery. There is occlusive thrombus in the left lower lobe pulmonary artery. Pulmonary arterial pressures were then obtained. The main pulmonary arterial pressure is 41/22 with a mean of 30 mm Hg. The C2 cobra catheter was then exchanged over a Rosen wire for a 100 cm vertebral catheter. Using a glidewire, the catheter was successfully navigated beyond the occlusive thrombus in the right lower lobe pulmonary artery. The Glidewire was then exchanged for a Rosen wire and a 12 cm EKOS infusion catheter advanced over the wire. The catheter was flushed. The C2 cobra catheter was then advanced over a Bentson wire through the more medial sheath in used to select the left pulmonary artery. In the catheter was advanced beyond the thrombus in the left lower lobe pulmonary artery. A Rosen wire was placed. The cobra catheter was  removed and a 12 cm EKOS infusion catheter was advanced over the wire. The 6 French sheaths were secured to the skin with 2-0 Prolene. Arterial thrombolysis was initiated with 1 milligram/hour tPA per catheter. FINDINGS: Pulmonary arterial hypertension. The main pulmonary arterial pressure is 41/22 with a mean of 30 mm Hg IMPRESSION: Successful initiation of bilateral catheter directed ultrasound assisted thrombolysis with EKOS. Signed, Sterling Big, MD [facility] Radiology Electronically Signed   By: Malachy Moan M.D.   On: 03/31/2016 20:59   Ir Angiogram Selective Each Additional Vessel  Result Date: 03/31/2016 INDICATION: 36 year old female with acute large volume bilateral pulmonary embolus and acute cor pulmonale. EXAM: IR INFUSION THROMBOL ARTERIAL INITIAL (MS); ADDITIONAL ARTERIOGRAPHY; IR ULTRASOUND GUIDANCE VASC ACCESS RIGHT; BILATERAL PULMONARY ARTERIOGRAPHY COMPARISON:  CTA chest 03/31/2016 MEDICATIONS: None. ANESTHESIA/SEDATION: Versed 1 mg IV; Fentanyl 25 mcg IV Moderate Sedation Time:  30 minutes The patient was continuously monitored during the procedure by the interventional radiology nurse under my direct supervision. FLUOROSCOPY TIME:  Fluoroscopy Time: 14 minutes 0 seconds (40 mGy). COMPLICATIONS: None immediate. TECHNIQUE: Informed written consent was obtained from the patient after a thorough discussion of the procedural risks, benefits and alternatives. All questions were addressed. Maximal Sterile Barrier Technique was utilized including caps, mask, sterile gowns, sterile gloves, sterile drape, hand hygiene and skin antiseptic. A timeout was performed prior to the initiation of the procedure. The right common femoral vein was interrogated with ultrasound and found to be widely patent. An image was obtained and stored for the medical record. Local anesthesia was attained by infiltration with 1% lidocaine. A small dermatotomy was  made. Under real-time sonographic guidance, the vessel was punctured with a 21 gauge micropuncture needle. Using standard technique, the initial micro needle was exchanged over a 0.018 micro wire for a transitional 4 [place] micro sheath. The micro sheath was then exchanged over a 0.035 wire for a 6 French sheath. Using the same technique, a second right common femoral venous access was attained under ultrasound guidance and a second 6 [place] vascular sheath placed. A C2 cobra catheter was advanced over a Bentson wire through the more lateral sheath and advanced into the main pulmonary artery. A bilateral pulmonary arteriogram was performed. There is significant occlusive thrombus in the right main pulmonary artery extending into the right lower lobe pulmonary artery. Subocclusive thrombus is also present in the right upper lobar pulmonary artery. There is occlusive thrombus in the left lower lobe  pulmonary artery. Pulmonary arterial pressures were then obtained. The main pulmonary arterial pressure is 41/22 with a mean of 30 mm Hg. The C2 cobra catheter was then exchanged over a Rosen wire for a 100 cm vertebral catheter. Using a glidewire, the catheter was successfully navigated beyond the occlusive thrombus in the right lower lobe pulmonary artery. The Glidewire was then exchanged for a Rosen wire and a 12 cm EKOS infusion catheter advanced over the wire. The catheter was flushed. The C2 cobra catheter was then advanced over a Bentson wire through the more medial sheath in used to select the left pulmonary artery. In the catheter was advanced beyond the thrombus in the left lower lobe pulmonary artery. A Rosen wire was placed. The cobra catheter was removed and a 12 cm EKOS infusion catheter was advanced over the wire. The 6 French sheaths were secured to the skin with 2-0 Prolene. Arterial thrombolysis was initiated with 1 milligram/hour tPA per catheter. FINDINGS: Pulmonary arterial hypertension. The main  pulmonary arterial pressure is 41/22 with a mean of 30 mm Hg IMPRESSION: Successful initiation of bilateral catheter directed ultrasound assisted thrombolysis with EKOS. Signed, Sterling Big, MD [facility] Radiology Electronically Signed   By: Malachy Moan M.D.   On: 03/31/2016 20:59   Ir US Guide Vasc Access Right  Result Date: 03/31/2016 INDICATION: 36 year old female with acute large volume bilateral pulmonary embolus and acute cor pulmonale. EXAM: IR INFUSION THROMBOL ARTERIAL INITIAL (MS); ADDITIONAL ARTERIOGRAPHY; IR ULTRASOUND GUIDANCE VASC ACCESS RIGHT; BILATERAL PULMONARY ARTERIOGRAPHY COMPARISON:  CTA chest 03/31/2016 MEDICATIONS: None. ANESTHESIA/SEDATION: Versed 1 mg IV; Fentanyl 25 mcg IV Moderate Sedation Time:  30 minutes The patient was continuously monitored during the procedure by the interventional radiology nurse under my direct supervision. FLUOROSCOPY TIME:  Fluoroscopy Time: 14 minutes 0 seconds (40 mGy). COMPLICATIONS: None immediate. TECHNIQUE: Informed written consent was obtained from the patient after a thorough discussion of the procedural risks, benefits and alternatives. All questions were addressed. Maximal Sterile Barrier Technique was utilized including caps, mask, sterile gowns, sterile gloves, sterile drape, hand hygiene and skin antiseptic. A timeout was performed prior to the initiation of the procedure. The right common femoral vein was interrogated with ultrasound and found to be widely patent. An image was obtained and stored for the medical record. Local anesthesia was attained by infiltration with 1% lidocaine. A small dermatotomy was made. Under real-time sonographic guidance, the vessel was punctured with a 21 gauge micropuncture needle. Using standard technique, the initial micro needle was exchanged over a 0.018 micro wire for a transitional 4 [place] micro sheath. The micro sheath was then exchanged over  a 0.035 wire for a 6 French sheath. Using the same technique, a second right common femoral venous access was attained under ultrasound guidance and a second 6 [place] vascular sheath placed. A C2 cobra catheter was advanced over a Bentson wire through the more lateral sheath and advanced into the main pulmonary artery. A bilateral pulmonary arteriogram was performed. There is significant occlusive thrombus in the right main pulmonary artery extending into the right lower lobe pulmonary artery. Subocclusive thrombus is also present in the right upper lobar pulmonary artery. There is occlusive thrombus in the left lower lobe pulmonary artery. Pulmonary arterial pressures were then obtained. The main pulmonary arterial pressure is 41/22 with a mean of 30 mm Hg. The C2 cobra catheter was then exchanged over a Rosen wire for a 100 cm vertebral catheter. Using a glidewire, the catheter  was successfully navigated beyond the occlusive thrombus in the right lower lobe pulmonary artery. The Glidewire was then exchanged for a Rosen wire and a 12 cm EKOS infusion catheter advanced over the wire. The catheter was flushed. The C2 cobra catheter was then advanced over a Bentson wire through the more medial sheath in used to select the left pulmonary artery. In the catheter was advanced beyond the thrombus in the left lower lobe pulmonary artery. A Rosen wire was placed. The cobra catheter was removed and a 12 cm EKOS infusion catheter was advanced over the wire. The 6 French sheaths were secured to the skin with 2-0 Prolene. Arterial thrombolysis was initiated with 1 milligram/hour tPA per catheter. FINDINGS: Pulmonary arterial hypertension. The main pulmonary arterial pressure is 41/22 with a mean of 30 mm Hg IMPRESSION: Successful initiation of bilateral catheter directed ultrasound assisted thrombolysis with EKOS. Signed, Sterling Big, MD [facility] Radiology  Electronically Signed   By: Malachy Moan M.D.   On: 03/31/2016 20:59   Dg Chest Portable 1 View  Result Date: 03/31/2016 CLINICAL DATA:  Shortness of breath EXAM: PORTABLE CHEST 1 VIEW COMPARISON:  Portable exam 1731 hours without priors for comparison FINDINGS: Normal heart size, mediastinal contours, and pulmonary vascularity. Infiltrate identified at the lateral RIGHT lung base consistent with pneumonia. Remaining lungs clear. No pleural effusion or pneumothorax. Bones unremarkable. IMPRESSION: RIGHT basilar infiltrate consistent with pneumonia. Electronically Signed   By: Ulyses Southward M.D.   On: 03/31/2016 17:46   Ir Infusion Thrombol Arterial Initial (ms)  Result Date: 03/31/2016 INDICATION: 36 year old female with acute large volume bilateral pulmonary embolus and acute cor pulmonale. EXAM: IR INFUSION THROMBOL ARTERIAL INITIAL (MS); ADDITIONAL ARTERIOGRAPHY; IR ULTRASOUND GUIDANCE VASC ACCESS RIGHT; BILATERAL PULMONARY ARTERIOGRAPHY COMPARISON:  CTA chest 03/31/2016 MEDICATIONS: None. ANESTHESIA/SEDATION: Versed 1 mg IV; Fentanyl 25 mcg IV Moderate Sedation Time:  30 minutes The patient was continuously monitored during the procedure by the interventional radiology nurse under my direct supervision. FLUOROSCOPY TIME:  Fluoroscopy Time: 14 minutes 0 seconds (40 mGy). COMPLICATIONS: None immediate. TECHNIQUE: Informed written consent was obtained from the patient after a thorough discussion of the procedural risks, benefits and alternatives. All questions were addressed. Maximal Sterile Barrier Technique was utilized including caps, mask, sterile gowns, sterile gloves, sterile drape, hand hygiene and skin antiseptic. A timeout was performed prior to the initiation of the procedure. The right common femoral vein was interrogated with ultrasound and found to be widely patent. An image was obtained and stored for the medical record. Local anesthesia was attained by infiltration with 1% lidocaine. A  small dermatotomy was made. Under real-time sonographic guidance, the vessel was punctured with a 21 gauge micropuncture needle. Using standard technique, the initial micro needle was exchanged over a 0.018 micro wire for a transitional 4 [place] micro sheath. The micro sheath was then exchanged over a 0.035 wire for a 6 French sheath. Using the same technique, a second right common femoral venous access was attained under ultrasound guidance and a second 6 [place] vascular sheath placed. A C2 cobra catheter was advanced over a Bentson wire through the more lateral sheath and advanced into the main pulmonary artery. A bilateral pulmonary arteriogram was performed. There is significant occlusive thrombus in the right main pulmonary artery extending into the right lower lobe pulmonary artery. Subocclusive thrombus is also present in the right upper lobar pulmonary artery. There is occlusive thrombus in the left lower lobe pulmonary artery. Pulmonary arterial pressures  were then obtained. The main pulmonary arterial pressure is 41/22 with a mean of 30 mm Hg. The C2 cobra catheter was then exchanged over a Rosen wire for a 100 cm vertebral catheter. Using a glidewire, the catheter was successfully navigated beyond the occlusive thrombus in the right lower lobe pulmonary artery. The Glidewire was then exchanged for a Rosen wire and a 12 cm EKOS infusion catheter advanced over the wire. The catheter was flushed. The C2 cobra catheter was then advanced over a Bentson wire through the more medial sheath in used to select the left pulmonary artery. In the catheter was advanced beyond the thrombus in the left lower lobe pulmonary artery. A Rosen wire was placed. The cobra catheter was removed and a 12 cm EKOS infusion catheter was advanced over the wire. The 6 French sheaths were secured to the skin with 2-0 Prolene. Arterial thrombolysis was initiated with 1 milligram/hour tPA per catheter. FINDINGS: Pulmonary arterial  hypertension. The main pulmonary arterial pressure is 41/22 with a mean of 30 mm Hg IMPRESSION: Successful initiation of bilateral catheter directed ultrasound assisted thrombolysis with EKOS. Signed, Sterling Big, MD [facility] Radiology Electronically Signed   By: Malachy Moan M.D.   On: 03/31/2016 20:59   Ir Infusion Thrombol Arterial Initial (ms)  Result Date: 03/31/2016 INDICATION: 36 year old female with acute large volume bilateral pulmonary embolus and acute cor pulmonale. EXAM: IR INFUSION THROMBOL ARTERIAL INITIAL (MS); ADDITIONAL ARTERIOGRAPHY; IR ULTRASOUND GUIDANCE VASC ACCESS RIGHT; BILATERAL PULMONARY ARTERIOGRAPHY COMPARISON:  CTA chest 03/31/2016 MEDICATIONS: None. ANESTHESIA/SEDATION: Versed 1 mg IV; Fentanyl 25 mcg IV Moderate Sedation Time:  30 minutes The patient was continuously monitored during the procedure by the interventional radiology nurse under my direct supervision. FLUOROSCOPY TIME:  Fluoroscopy Time: 14 minutes 0 seconds (40 mGy). COMPLICATIONS: None immediate. TECHNIQUE: Informed written consent was obtained from the patient after a thorough discussion of the procedural risks, benefits and alternatives. All questions were addressed. Maximal Sterile Barrier Technique was utilized including caps, mask, sterile gowns, sterile gloves, sterile drape, hand hygiene and skin antiseptic. A timeout was performed prior to the initiation of the procedure. The right common femoral vein was interrogated with ultrasound and found to be widely patent. An image was obtained and stored for the medical record. Local anesthesia was attained by infiltration with 1% lidocaine. A small dermatotomy was made. Under real-time sonographic guidance, the vessel was punctured with a 21 gauge micropuncture needle. Using standard technique, the initial micro needle was exchanged over a 0.018 micro wire for a transitional 4 [place] micro sheath. The  micro sheath was then exchanged over a 0.035 wire for a 6 French sheath. Using the same technique, a second right common femoral venous access was attained under ultrasound guidance and a second 6 [place] vascular sheath placed. A C2 cobra catheter was advanced over a Bentson wire through the more lateral sheath and advanced into the main pulmonary artery. A bilateral pulmonary arteriogram was performed. There is significant occlusive thrombus in the right main pulmonary artery extending into the right lower lobe pulmonary artery. Subocclusive thrombus is also present in the right upper lobar pulmonary artery. There is occlusive thrombus in the left lower lobe pulmonary artery. Pulmonary arterial pressures were then obtained. The main pulmonary arterial pressure is 41/22 with a mean of 30 mm Hg. The C2 cobra catheter was then exchanged over a Rosen wire for a 100 cm vertebral catheter. Using a glidewire, the catheter was successfully navigated beyond the  occlusive thrombus in the right lower lobe pulmonary artery. The Glidewire was then exchanged for a Rosen wire and a 12 cm EKOS infusion catheter advanced over the wire. The catheter was flushed. The C2 cobra catheter was then advanced over a Bentson wire through the more medial sheath in used to select the left pulmonary artery. In the catheter was advanced beyond the thrombus in the left lower lobe pulmonary artery. A Rosen wire was placed. The cobra catheter was removed and a 12 cm EKOS infusion catheter was advanced over the wire. The 6 French sheaths were secured to the skin with 2-0 Prolene. Arterial thrombolysis was initiated with 1 milligram/hour tPA per catheter. FINDINGS: Pulmonary arterial hypertension. The main pulmonary arterial pressure is 41/22 with a mean of 30 mm Hg IMPRESSION: Successful initiation of bilateral catheter directed ultrasound assisted thrombolysis with EKOS. Signed, Sterling Big, MD [facility] Radiology Electronically Signed   By: Malachy Moan M.D.   On: 03/31/2016 20:59    Procedures Procedures (including critical care time)  Medications Ordered in ED Medications  0.9 %  sodium chloride infusion (not administered)  0.9 %  sodium chloride infusion (125 mL/hr Intravenous New Bag/Given 03/31/16 1908)  heparin ADULT infusion 100 units/mL (25000 units/260mL sodium chloride 0.45%) (1,200 Units/hr Intravenous New Bag/Given 03/31/16 1905)  lidocaine (PF) (XYLOCAINE) 1 % injection (not administered)  insulin aspart (novoLOG) injection 0-15 Units (0 Units Subcutaneous Not Given 04/01/16 0000)  alteplase (tPA/ACTIVASE) 12 mg in sodium chloride 0.9 % 238 mL (12 mg Intravenous Given 03/31/16 2144)  alteplase (tPA/ACTIVASE) 12 mg in sodium chloride 0.9 % 238 mL (12 mg Intravenous Given 03/31/16 2141)  0.9 %  sodium chloride infusion ( Intravenous Transfusing/Transfer 03/31/16 2245)  0.9 %  sodium chloride infusion ( Intravenous Transfusing/Transfer 03/31/16 2245)  0.9 %  sodium chloride infusion (not administered)  0.9 %  sodium chloride infusion (not administered)  fentaNYL (SUBLIMAZE) 100 MCG/2ML injection (not administered)  midazolam (VERSED) 2 MG/2ML injection (not administered)  sodium chloride flush (NS) 0.9 % injection 3 mL (not administered)  sodium chloride flush (NS) 0.9 % injection 3 mL (not administered)  0.9 %  sodium chloride infusion (not administered)  sodium chloride 0.9 % bolus 1,000 mL (0 mLs Intravenous Stopped 03/31/16 1911)  heparin bolus via infusion 4,500 Units (4,500 Units Intravenous Bolus from Bag 03/31/16 1908)  iopamidol (ISOVUE-300) 61 % injection (15 mLs  Contrast Given 03/31/16 2039)  midazolam (VERSED) injection (0.5 mg Intravenous Given 03/31/16 2005)  fentaNYL (SUBLIMAZE) injection (25 mcg Intravenous Given 03/31/16 2000)  lidocaine (PF) (XYLOCAINE) 1 % injection (5 mLs Infiltration Given 03/31/16 2000)     Initial Impression /  Assessment and Plan / ED Course  I have reviewed the triage vital signs and the nursing notes.  Pertinent labs & imaging results that were available during my care of the patient were reviewed by me and considered in my medical decision making (see chart for details).     Patient is in respiratory distress on arrival. Hypotensive with blood pressures as low as 80 systolic, and tachycardic to 140 bpm. Afebrile. Lungs are clear to auscultation bilaterally, but she is tachypneic and with increased work of breathing. Oxygen saturation is in the 70s on room air. Improved to the low 90s on a nonrebreather, and then to the mid to high 90s with improvement in work of breathing on BiPAP. EKG with sinus tachycardia. Chest x-ray remarkable for right basilar infiltrate. Secondary to high concern  for PE, CTA of the chest obtained that shows at least a submassive PE, with presence of right heart strain. Pt has a troponin elevation to 0.35, and lactic acidosis to 5.74. Fluid resuscitation initiated. Case discussed with intensive care, who discussed the case with interventional radiology. Patient will be brought to IR for removal of clot burden, and then admitted to intensive care.  Care of patient overseen by my attending, Dr. Rush Landmark.   Final Clinical Impressions(s) / ED Diagnoses   Final diagnoses:  Other acute pulmonary embolism without acute cor pulmonale [facility])    New Prescriptions Current Discharge Medication List       Charlie Pitter, MD 04/01/16 0130    Canary Brim Tegeler, MD 04/03/16 1027

## 2016-03-31 NOTE — Progress Notes (Signed)
ANTICOAGULATION CONSULT NOTE - Initial Consult  Pharmacy Consult for heparin Indication: pulmonary embolus  Allergies  Allergen Reactions  . Latex Rash    Patient Measurements: Height: 5\' 11"  (180.3 cm) Weight: 158 lb 1.1 oz (71.7 kg) IBW/kg (Calculated) : 70.8 Heparin Dosing Weight: 71.7kg  Vital Signs:    Labs:  Recent Labs  03/31/16 1828  HGB 10.3*  HCT 32.4*  PLT 127*  LABPROT 16.7*  INR 1.34  CREATININE 1.29*    Estimated Creatinine Clearance: 69.3 mL/min (A) (by C-G formula based on SCr of 1.29 mg/dL (H)).   Medical History: Past Medical History:  Diagnosis Date  . Anemia   . Scoliosis     Medications:  Infusions:  . sodium chloride    . sodium chloride    . heparin    . sodium chloride      Assessment: 35 yof presented to the ED with SOB and syncope. Found to have an acute PE with right heart strain. To start IV heparin. Baseline H/H and platelets are slightly low. She does not take any anticoagulation PTA.   Goal of Therapy:  Heparin level 0.3-0.7 units/ml Monitor platelets by anticoagulation protocol: Yes   Plan:  Heparin bolus 4500 units IV x 1 Heparin gtt 1200 units/hr Check a 6 hr heparin level Daily heparin level and CBC  Samantha Friedman, Samantha Friedman 03/31/2016,7:03 PM

## 2016-03-31 NOTE — Progress Notes (Signed)
   03/31/16 1700  Clinical Encounter Type  Visited With Health care provider  Visit Type Initial  Referral From Nurse  Consult/Referral To Chaplain    Called friend for family information regarding patient in trauma C. Arnold Kester L. Salomon FickBanks, MDiv

## 2016-03-31 NOTE — Procedures (Signed)
Interventional Radiology Procedure Note  Procedure: Pulmonary angiogram and initiation of US-assisted thrombolysis with EKOS.  Vascular Access: 38F sheath x2 in the right groin (CFV)  Findings: Pulmonary HTN 41/22 (30) mmHg  Complications: None  Estimated Blood Loss: None  Recommendations: - Bedrest overnight with right leg straight - Labs per protocol - tPA 1 mg/hr per catheter x 12 hrs (total dose 24 mg) - IR to check pressures and pull sheaths in the am   Signed,  Sterling BigHeath K. McCullough, MD

## 2016-03-31 NOTE — ED Notes (Signed)
Abnormal I-stat troponin and lactic acid result given to [employer]

## 2016-04-01 ENCOUNTER — Inpatient Hospital Stay (HOSPITAL_COMMUNITY): Payer: 59

## 2016-04-01 ENCOUNTER — Other Ambulatory Visit: Payer: Self-pay | Admitting: Radiology

## 2016-04-01 ENCOUNTER — Encounter (HOSPITAL_COMMUNITY): Payer: Self-pay | Admitting: Interventional Radiology

## 2016-04-01 DIAGNOSIS — I2699 Other pulmonary embolism without acute cor pulmonale: Secondary | ICD-10-CM

## 2016-04-01 DIAGNOSIS — Z79899 Other long term (current) drug therapy: Secondary | ICD-10-CM

## 2016-04-01 DIAGNOSIS — E878 Other disorders of electrolyte and fluid balance, not elsewhere classified: Secondary | ICD-10-CM

## 2016-04-01 DIAGNOSIS — J9601 Acute respiratory failure with hypoxia: Secondary | ICD-10-CM

## 2016-04-01 DIAGNOSIS — D649 Anemia, unspecified: Secondary | ICD-10-CM

## 2016-04-01 DIAGNOSIS — D696 Thrombocytopenia, unspecified: Secondary | ICD-10-CM

## 2016-04-01 HISTORY — PX: IR GENERIC HISTORICAL: IMG1180011

## 2016-04-01 LAB — GLUCOSE, CAPILLARY
GLUCOSE-CAPILLARY: 83 mg/dL (ref 65–99)
GLUCOSE-CAPILLARY: 84 mg/dL (ref 65–99)
GLUCOSE-CAPILLARY: 118 mg/dL — AB (ref 65–99)
GLUCOSE-CAPILLARY: 117 mg/dL — AB (ref 65–99)
Glucose-Capillary: 100 mg/dL — ABNORMAL HIGH (ref 65–99)
Glucose-Capillary: 103 mg/dL — ABNORMAL HIGH (ref 65–99)
Glucose-Capillary: 84 mg/dL (ref 65–99)

## 2016-04-01 LAB — CBC
HCT: 27.1 % — ABNORMAL LOW (ref 36.0–46.0)
HCT: 26.9 % — ABNORMAL LOW (ref 36.0–46.0)
HCT: 26.5 % — ABNORMAL LOW (ref 36.0–46.0)
HEMOGLOBIN: 8.8 g/dL — AB (ref 12.0–15.0)
Hemoglobin: 9.1 g/dL — ABNORMAL LOW (ref 12.0–15.0)
Hemoglobin: 8.6 g/dL — ABNORMAL LOW (ref 12.0–15.0)
MCH: 30.8 pg (ref 26.0–34.0)
MCH: 30.3 pg (ref 26.0–34.0)
MCH: 30.1 pg (ref 26.0–34.0)
MCHC: 33.6 g/dL (ref 30.0–36.0)
MCHC: 32.7 g/dL (ref 30.0–36.0)
MCHC: 32.5 g/dL (ref 30.0–36.0)
MCV: 91.9 fL (ref 78.0–100.0)
MCV: 92.8 fL (ref 78.0–100.0)
MCV: 92.7 fL (ref 78.0–100.0)
PLATELETS: 107 10*3/uL — AB (ref 150–400)
PLATELETS: 104 10*3/uL — AB (ref 150–400)
Platelets: 91 10*3/uL — ABNORMAL LOW (ref 150–400)
RBC: 2.95 MIL/uL — ABNORMAL LOW (ref 3.87–5.11)
RBC: 2.9 MIL/uL — AB (ref 3.87–5.11)
RBC: 2.86 MIL/uL — ABNORMAL LOW (ref 3.87–5.11)
RDW: 13.2 % (ref 11.5–15.5)
RDW: 13 % (ref 11.5–15.5)
RDW: 13 % (ref 11.5–15.5)
WBC: 8.2 10*3/uL (ref 4.0–10.5)
WBC: 8.4 10*3/uL (ref 4.0–10.5)
WBC: 10 10*3/uL (ref 4.0–10.5)

## 2016-04-01 LAB — FIBRINOGEN
FIBRINOGEN: 300 mg/dL (ref 210–475)
FIBRINOGEN: 317 mg/dL (ref 210–475)
Fibrinogen: 310 mg/dL (ref 210–475)

## 2016-04-01 LAB — HEPARIN LEVEL (UNFRACTIONATED)
HEPARIN UNFRACTIONATED: 0.57 [IU]/mL (ref 0.30–0.70)
HEPARIN UNFRACTIONATED: 0.54 [IU]/mL (ref 0.30–0.70)
Heparin Unfractionated: 0.79 IU/mL — ABNORMAL HIGH (ref 0.30–0.70)
Heparin Unfractionated: 0.54 IU/mL (ref 0.30–0.70)

## 2016-04-01 LAB — BASIC METABOLIC PANEL
Anion gap: 10 (ref 5–15)
BUN: 8 mg/dL (ref 6–20)
CALCIUM: 7.9 mg/dL — AB (ref 8.9–10.3)
CHLORIDE: 108 mmol/L (ref 101–111)
CO2: 20 mmol/L — ABNORMAL LOW (ref 22–32)
CREATININE: 0.84 mg/dL (ref 0.44–1.00)
GFR calc Af Amer: >60 mL/min (ref >60)
GFR calc non Af Amer: >60 mL/min (ref >60)
Glucose, Bld: 104 mg/dL — ABNORMAL HIGH (ref 65–99)
Potassium: 4.1 mmol/L (ref 3.5–5.1)
SODIUM: 138 mmol/L (ref 135–145)

## 2016-04-01 LAB — PHOSPHORUS: Phosphorus: 3.4 mg/dL (ref 2.5–4.6)

## 2016-04-01 LAB — HIV ANTIBODY (ROUTINE TESTING W REFLEX): HIV Screen 4th Generation wRfx: NONREACTIVE

## 2016-04-01 LAB — MAGNESIUM: MAGNESIUM: 1.8 mg/dL (ref 1.7–2.4)

## 2016-04-01 LAB — MRSA PCR SCREENING: MRSA BY PCR: NEGATIVE

## 2016-04-01 MED ORDER — WHITE PETROLATUM GEL
Status: AC
  Administered 2016-04-01: 04:00:00
  Filled 2016-04-01: qty 1

## 2016-04-01 MED ORDER — MAGNESIUM SULFATE 2 GM/50ML IV SOLN
2.0000 g | Freq: Once | INTRAVENOUS | Status: AC
  Administered 2016-04-01: 2 g via INTRAVENOUS
  Filled 2016-04-01: qty 50

## 2016-04-01 NOTE — Progress Notes (Signed)
Pt reported that she lost her glasses. Glasses were not brought to room with pt when she was received from IR. RN spoke with charge RN to check and see if they were in the ED. Charge nurse informed this RN that they were not there either. Informed pt. Pt stated " They might be at Physical Therapy where I passed out."   Roselie Awkward, RN

## 2016-04-01 NOTE — Assessment & Plan Note (Signed)
Acute in seting of PE and lysis and heparnio  Plan Monitor closely Continue anticoag for now

## 2016-04-01 NOTE — Progress Notes (Signed)
PULMONARY / CRITICAL CARE MEDICINE   Name: Samantha Friedman MRN: [id number] DOB: Jun 03, 1980    ADMISSION DATE:  03/31/2016 CONSULTATION DATE:  03/31/16  REFERRING MD:  Lewie Chamber - EDP  CHIEF COMPLAINT:  SOB  BRIEF Samantha Friedman is a 36 y.o. F with PMH of anemia and scoliosis.  She presented to [facility] ED 3/29 with sharp pain between shoulder blades, SOB, diaphoresis, syncopal episode. She was supposedly exercising on a exercise bike when she lost consciousness. On EMS arrival, she was hypotensive and hypoxic.   She was brought to ED with CT of the chest revealed massive PE (RV/LV = 1.6).  She was also in respiratory distress therefore was placed on BiPAP due to increased work of breathing.  PCCM was asked to admit to ICU and consider EKOS.  Given increased work of breathing and troponin bump, IR was consulted and will proceed with EKOS.  Of note, she had ankle surgery in January of this year for ruptured Achilles tendon. She is also on oral contraceptives. She has not had any recent travel, hemoptysis, history of malignancy, prior VTE.    STUDIES:  CTA Chest 3/29 > acute bilateral PE (RV / LV = 1.6) Echo 3/30 >  LE Duplex 3/30 >   CULTURES: None.  ANTIBIOTICS: None.  SIGNIFICANT EVENTS: 3/29 > admit.   EVENTS 3/29 - Remains on BIPAP. States that dyspnea has somewhat improved however feels like it is still hard to breath    SUBJECTIVE/OVERNIGHT/INTERVAL HX 3/30 -    VITAL SIGNS: BP 111/78   Pulse 100   Temp 98 F (36.7 C) (Oral)   Resp (!) 28   Ht 5\' 11"  (1.803 m)   Wt 79.9 kg (176 lb 1.6 oz)   SpO2 100%   BMI 24.56 kg/m   HEMODYNAMICS:    VENTILATOR SETTINGS: Vent Mode: BIPAP FiO2 (%):  [40 %-100 %] 40 % Set Rate:  [10 bmp] 10 bmp PEEP:  [5 cmH20] 5 cmH20 Pressure Support:  [3 cmH20] 3 cmH20  INTAKE / OUTPUT: I/O last 3 completed shifts: In: 3591.3 [I.V.:2591.3; IV Piggyback:1000] Out: 775 [Urine:775]  PHYSICAL EXAMINATION:   General Appearance:     Looks criticall ill OBESE - no  Head:    Normocephalic, without obvious abnormality, atraumatic  Eyes:    PERRL - yes, conjunctiva/corneas - clear      Ears:    Normal external ear canals, both ears  Nose:   NG tube - no  Throat:  ETT TUBE - no , OG tube - no  Neck:   Supple,  No enlargement/tenderness/nodules     Lungs:     Clear to auscultation bilaterally,   Chest wall:    No deformity  Heart:    S1 and S2 normal, no murmur, CVP - no.  Pressors - no  Abdomen:     Soft, no masses, no organomegaly  Genitalia:    Not done  Rectal:   not done  Extremities:   Extremities- intact     Skin:   Intact in exposed areas . Sacral area - no decub report     Neurologic:   Sedation - none -> RASS - +1 . Moves all 4s - yes. CAM-ICU - neg . Orientation - x 3 +      LABS: PULMONARY No results for input(s): PHART, PCO2ART, PO2ART, HCO3, TCO2, O2SAT in the last 168 hours.  Invalid input(s): PCO2, PO2  CBC  Recent Labs Lab 03/31/16 2233 04/01/16 0330 04/01/16 0958  HGB  9.5* 9.1* 8.8*  HCT 29.2* 27.1* 26.9*  WBC 12.4* 8.2 8.4  PLT 122* 107* 91*    COAGULATION  Recent Labs Lab 03/31/16 1828  INR 1.34    CARDIAC  No results for input(s): TROPONINI in the last 168 hours. No results for input(s): PROBNP in the last 168 hours.   CHEMISTRY  Recent Labs Lab 03/31/16 1828 04/01/16 0345  NA 139 138  K 3.8 4.1  CL 106 108  CO2 20* 20*  GLUCOSE 193* 104*  BUN 9 8  CREATININE 1.29* 0.84  CALCIUM 8.1* 7.9*  MG  --  1.8  PHOS  --  3.4   Estimated Creatinine Clearance: 106.5 mL/min (by C-G formula based on SCr of 0.84 mg/dL).   LIVER  Recent Labs Lab 03/31/16 1828  INR 1.34     INFECTIOUS  Recent Labs Lab 03/31/16 1834 03/31/16 2154  LATICACIDVEN 5.74* 1.6     ENDOCRINE CBG (last 3)   Recent Labs  03/31/16 2304 04/01/16 0406 04/01/16 0746  GLUCAP 101* 83 84         IMAGING x48h  - image(s) personally visualized  -   highlighted in bold Ct  Angio Chest Pe W And/or Wo Contrast  Result Date: 03/31/2016 CLINICAL DATA:  Hypoxia, hypotension and syncope. Chest pain. No cough or fever. On birth control. Recent surgery 01/14/2016. EXAM: CT ANGIOGRAPHY CHEST WITH CONTRAST TECHNIQUE: Multidetector CT imaging of the chest was performed using the standard protocol during bolus administration of intravenous contrast. Multiplanar CT image reconstructions and MIPs were obtained to evaluate the vascular anatomy. CONTRAST:  100 cc Isovue 370 IV COMPARISON:  None. FINDINGS: Cardiovascular: Acute bilateral multilobar pulmonary emboli with the RV/LV ratio 1.6. Near saddle embolus with most of the thrombus seen in the right main pulmonary artery. Cardiac chambers are normal in size. No pericardial effusion is identified. No aortic aneurysm or dissection. Mediastinum/Nodes: No enlarged mediastinal, hilar, or axillary lymph nodes. Thyroid gland, trachea, and esophagus demonstrate no significant findings. Lungs/Pleura: Parenchymal opacities in the periphery of the right lower lobe more likely represent pulmonary infarcts as opposed pneumonic consolidations given the clinical report of no fever and cough. Upper Abdomen: No acute abnormality. Musculoskeletal: Small cutaneous nodules along anterior lower chest wall. No acute or significant osseous findings. Review of the MIP images confirms the above findings. IMPRESSION: Positive for acute PE with CT evidence of right heart strain (RV/LV Ratio = 1.6) consistent with at least submassive (intermediate risk) PE. The presence of right heart strain has been associated with an increased risk of morbidity and mortality. Please activate Code PE by paging [phone]. Critical Value/emergent results were called by telephone at the time of interpretation on 03/31/2016 at 6:37 pm to Dr. Lynden Oxford , who verbally acknowledged these results. Peripheral right lower lobe pulmonary opacities consistent with pulmonary infarct as  opposed pneumonic consolidations given lack of clinical symptoms for pneumonia after discussion with Dr. Rush Landmark. Cutaneous nodules of the anterior chest wall overlying the lower sternum. Electronically Signed   By: Tollie Eth M.D.   On: 03/31/2016 18:39   Ir Angiogram Pulmonary Bilateral Selective  Result Date: 03/31/2016 INDICATION: 36 year old female with acute large volume bilateral pulmonary embolus and acute cor pulmonale. EXAM: IR INFUSION THROMBOL ARTERIAL INITIAL (MS); ADDITIONAL ARTERIOGRAPHY; IR ULTRASOUND GUIDANCE VASC ACCESS RIGHT; BILATERAL PULMONARY ARTERIOGRAPHY COMPARISON:  CTA chest 03/31/2016 MEDICATIONS: None. ANESTHESIA/SEDATION: Versed 1 mg IV; Fentanyl 25 mcg IV Moderate Sedation Time:  30 minutes The patient was continuously monitored during the  procedure by the interventional radiology nurse under my direct supervision. FLUOROSCOPY TIME:  Fluoroscopy Time: 14 minutes 0 seconds (40 mGy). COMPLICATIONS: None immediate. TECHNIQUE: Informed written consent was obtained from the patient after a thorough discussion of the procedural risks, benefits and alternatives. All questions were addressed. Maximal Sterile Barrier Technique was utilized including caps, mask, sterile gowns, sterile gloves, sterile drape, hand hygiene and skin antiseptic. A timeout was performed prior to the initiation of the procedure. The right common femoral vein was interrogated with ultrasound and found to be widely patent. An image was obtained and stored for the medical record. Local anesthesia was attained by infiltration with 1% lidocaine. A small dermatotomy was made. Under real-time sonographic guidance, the vessel was punctured with a 21 gauge micropuncture needle. Using standard technique, the initial micro needle was exchanged over a 0.018 micro wire for a transitional 4 [place] micro sheath. The micro sheath was then exchanged over a 0.035 wire for a 6 French sheath. Using the same technique, a second right  common femoral venous access was attained under ultrasound guidance and a second 6 [place] vascular sheath placed. A C2 cobra catheter was advanced over a Bentson wire through the more lateral sheath and advanced into the main pulmonary artery. A bilateral pulmonary arteriogram was performed. There is significant occlusive thrombus in the right main pulmonary artery extending into the right lower lobe pulmonary artery. Subocclusive thrombus is also present in the right upper lobar pulmonary artery. There is occlusive thrombus in the left lower lobe pulmonary artery. Pulmonary arterial pressures were then obtained. The main pulmonary arterial pressure is 41/22 with a mean of 30 mm Hg. The C2 cobra catheter was then exchanged over a Rosen wire for a 100 cm vertebral catheter. Using a glidewire, the catheter was successfully navigated beyond the occlusive thrombus in the right lower lobe pulmonary artery. The Glidewire was then exchanged for a Rosen wire and a 12 cm EKOS infusion catheter advanced over the wire. The catheter was flushed. The C2 cobra catheter was then advanced over a Bentson wire through the more medial sheath in used to select the left pulmonary artery. In the catheter was advanced beyond the thrombus in the left lower lobe pulmonary artery. A Rosen wire was placed. The cobra catheter was removed and a 12 cm EKOS infusion catheter was advanced over the wire. The 6 French sheaths were secured to the skin with 2-0 Prolene. Arterial thrombolysis was initiated with 1 milligram/hour tPA per catheter. FINDINGS: Pulmonary arterial hypertension. The main pulmonary arterial pressure is 41/22 with a mean of 30 mm Hg IMPRESSION: Successful initiation of bilateral catheter directed ultrasound assisted thrombolysis with EKOS. Signed, Sterling Big, MD [facility] Radiology Electronically Signed   By: Malachy Moan M.D.   On: 03/31/2016 20:59   Ir  Angiogram Selective Each Additional Vessel  Result Date: 03/31/2016 INDICATION: 36 year old female with acute large volume bilateral pulmonary embolus and acute cor pulmonale. EXAM: IR INFUSION THROMBOL ARTERIAL INITIAL (MS); ADDITIONAL ARTERIOGRAPHY; IR ULTRASOUND GUIDANCE VASC ACCESS RIGHT; BILATERAL PULMONARY ARTERIOGRAPHY COMPARISON:  CTA chest 03/31/2016 MEDICATIONS: None. ANESTHESIA/SEDATION: Versed 1 mg IV; Fentanyl 25 mcg IV Moderate Sedation Time:  30 minutes The patient was continuously monitored during the procedure by the interventional radiology nurse under my direct supervision. FLUOROSCOPY TIME:  Fluoroscopy Time: 14 minutes 0 seconds (40 mGy). COMPLICATIONS: None immediate. TECHNIQUE: Informed written consent was obtained from the patient after a thorough discussion of the procedural risks, benefits and alternatives.  All questions were addressed. Maximal Sterile Barrier Technique was utilized including caps, mask, sterile gowns, sterile gloves, sterile drape, hand hygiene and skin antiseptic. A timeout was performed prior to the initiation of the procedure. The right common femoral vein was interrogated with ultrasound and found to be widely patent. An image was obtained and stored for the medical record. Local anesthesia was attained by infiltration with 1% lidocaine. A small dermatotomy was made. Under real-time sonographic guidance, the vessel was punctured with a 21 gauge micropuncture needle. Using standard technique, the initial micro needle was exchanged over a 0.018 micro wire for a transitional 4 [place] micro sheath. The micro sheath was then exchanged over a 0.035 wire for a 6 French sheath. Using the same technique, a second right common femoral venous access was attained under ultrasound guidance and a second 6 [place] vascular sheath placed. A C2 cobra catheter was advanced over a Bentson wire through the more lateral sheath and advanced into the main pulmonary artery. A bilateral  pulmonary arteriogram was performed. There is significant occlusive thrombus in the right main pulmonary artery extending into the right lower lobe pulmonary artery. Subocclusive thrombus is also present in the right upper lobar pulmonary artery. There is occlusive thrombus in the left lower lobe pulmonary artery. Pulmonary arterial pressures were then obtained. The main pulmonary arterial pressure is 41/22 with a mean of 30 mm Hg. The C2 cobra catheter was then exchanged over a Rosen wire for a 100 cm vertebral catheter. Using a glidewire, the catheter was successfully navigated beyond the occlusive thrombus in the right lower lobe pulmonary artery. The Glidewire was then exchanged for a Rosen wire and a 12 cm EKOS infusion catheter advanced over the wire. The catheter was flushed. The C2 cobra catheter was then advanced over a Bentson wire through the more medial sheath in used to select the left pulmonary artery. In the catheter was advanced beyond the thrombus in the left lower lobe pulmonary artery. A Rosen wire was placed. The cobra catheter was removed and a 12 cm EKOS infusion catheter was advanced over the wire. The 6 French sheaths were secured to the skin with 2-0 Prolene. Arterial thrombolysis was initiated with 1 milligram/hour tPA per catheter. FINDINGS: Pulmonary arterial hypertension. The main pulmonary arterial pressure is 41/22 with a mean of 30 mm Hg IMPRESSION: Successful initiation of bilateral catheter directed ultrasound assisted thrombolysis with EKOS. Signed, Sterling Big, MD [facility] Radiology Electronically Signed   By: Malachy Moan M.D.   On: 03/31/2016 20:59   Ir Angiogram Selective Each Additional Vessel  Result Date: 03/31/2016 INDICATION: 36 year old female with acute large volume bilateral pulmonary embolus and acute cor pulmonale. EXAM: IR INFUSION THROMBOL ARTERIAL INITIAL (MS); ADDITIONAL ARTERIOGRAPHY; IR  ULTRASOUND GUIDANCE VASC ACCESS RIGHT; BILATERAL PULMONARY ARTERIOGRAPHY COMPARISON:  CTA chest 03/31/2016 MEDICATIONS: None. ANESTHESIA/SEDATION: Versed 1 mg IV; Fentanyl 25 mcg IV Moderate Sedation Time:  30 minutes The patient was continuously monitored during the procedure by the interventional radiology nurse under my direct supervision. FLUOROSCOPY TIME:  Fluoroscopy Time: 14 minutes 0 seconds (40 mGy). COMPLICATIONS: None immediate. TECHNIQUE: Informed written consent was obtained from the patient after a thorough discussion of the procedural risks, benefits and alternatives. All questions were addressed. Maximal Sterile Barrier Technique was utilized including caps, mask, sterile gowns, sterile gloves, sterile drape, hand hygiene and skin antiseptic. A timeout was performed prior to the initiation of the procedure. The right common femoral vein was interrogated with ultrasound  and found to be widely patent. An image was obtained and stored for the medical record. Local anesthesia was attained by infiltration with 1% lidocaine. A small dermatotomy was made. Under real-time sonographic guidance, the vessel was punctured with a 21 gauge micropuncture needle. Using standard technique, the initial micro needle was exchanged over a 0.018 micro wire for a transitional 4 [place] micro sheath. The micro sheath was then exchanged over a 0.035 wire for a 6 French sheath. Using the same technique, a second right common femoral venous access was attained under ultrasound guidance and a second 6 [place] vascular sheath placed. A C2 cobra catheter was advanced over a Bentson wire through the more lateral sheath and advanced into the main pulmonary artery. A bilateral pulmonary arteriogram was performed. There is significant occlusive thrombus in the right main pulmonary artery extending into the right lower lobe pulmonary artery. Subocclusive thrombus is also present in the right upper lobar pulmonary artery. There is  occlusive thrombus in the left lower lobe pulmonary artery. Pulmonary arterial pressures were then obtained. The main pulmonary arterial pressure is 41/22 with a mean of 30 mm Hg. The C2 cobra catheter was then exchanged over a Rosen wire for a 100 cm vertebral catheter. Using a glidewire, the catheter was successfully navigated beyond the occlusive thrombus in the right lower lobe pulmonary artery. The Glidewire was then exchanged for a Rosen wire and a 12 cm EKOS infusion catheter advanced over the wire. The catheter was flushed. The C2 cobra catheter was then advanced over a Bentson wire through the more medial sheath in used to select the left pulmonary artery. In the catheter was advanced beyond the thrombus in the left lower lobe pulmonary artery. A Rosen wire was placed. The cobra catheter was removed and a 12 cm EKOS infusion catheter was advanced over the wire. The 6 French sheaths were secured to the skin with 2-0 Prolene. Arterial thrombolysis was initiated with 1 milligram/hour tPA per catheter. FINDINGS: Pulmonary arterial hypertension. The main pulmonary arterial pressure is 41/22 with a mean of 30 mm Hg IMPRESSION: Successful initiation of bilateral catheter directed ultrasound assisted thrombolysis with EKOS. Signed, Sterling Big, MD [facility] Radiology Electronically Signed   By: Malachy Moan M.D.   On: 03/31/2016 20:59   Ir US Guide Vasc Access Right  Result Date: 03/31/2016 INDICATION: 36 year old female with acute large volume bilateral pulmonary embolus and acute cor pulmonale. EXAM: IR INFUSION THROMBOL ARTERIAL INITIAL (MS); ADDITIONAL ARTERIOGRAPHY; IR ULTRASOUND GUIDANCE VASC ACCESS RIGHT; BILATERAL PULMONARY ARTERIOGRAPHY COMPARISON:  CTA chest 03/31/2016 MEDICATIONS: None. ANESTHESIA/SEDATION: Versed 1 mg IV; Fentanyl 25 mcg IV Moderate Sedation Time:  30 minutes The patient was continuously monitored during the procedure  by the interventional radiology nurse under my direct supervision. FLUOROSCOPY TIME:  Fluoroscopy Time: 14 minutes 0 seconds (40 mGy). COMPLICATIONS: None immediate. TECHNIQUE: Informed written consent was obtained from the patient after a thorough discussion of the procedural risks, benefits and alternatives. All questions were addressed. Maximal Sterile Barrier Technique was utilized including caps, mask, sterile gowns, sterile gloves, sterile drape, hand hygiene and skin antiseptic. A timeout was performed prior to the initiation of the procedure. The right common femoral vein was interrogated with ultrasound and found to be widely patent. An image was obtained and stored for the medical record. Local anesthesia was attained by infiltration with 1% lidocaine. A small dermatotomy was made. Under real-time sonographic guidance, the vessel was punctured with a 21 gauge micropuncture needle.  Using standard technique, the initial micro needle was exchanged over a 0.018 micro wire for a transitional 4 [place] micro sheath. The micro sheath was then exchanged over a 0.035 wire for a 6 French sheath. Using the same technique, a second right common femoral venous access was attained under ultrasound guidance and a second 6 [place] vascular sheath placed. A C2 cobra catheter was advanced over a Bentson wire through the more lateral sheath and advanced into the main pulmonary artery. A bilateral pulmonary arteriogram was performed. There is significant occlusive thrombus in the right main pulmonary artery extending into the right lower lobe pulmonary artery. Subocclusive thrombus is also present in the right upper lobar pulmonary artery. There is occlusive thrombus in the left lower lobe pulmonary artery. Pulmonary arterial pressures were then obtained. The main pulmonary arterial pressure is 41/22 with a mean of 30 mm Hg. The C2 cobra catheter was then exchanged over a Rosen wire for a 100 cm vertebral catheter. Using a  glidewire, the catheter was successfully navigated beyond the occlusive thrombus in the right lower lobe pulmonary artery. The Glidewire was then exchanged for a Rosen wire and a 12 cm EKOS infusion catheter advanced over the wire. The catheter was flushed. The C2 cobra catheter was then advanced over a Bentson wire through the more medial sheath in used to select the left pulmonary artery. In the catheter was advanced beyond the thrombus in the left lower lobe pulmonary artery. A Rosen wire was placed. The cobra catheter was removed and a 12 cm EKOS infusion catheter was advanced over the wire. The 6 French sheaths were secured to the skin with 2-0 Prolene. Arterial thrombolysis was initiated with 1 milligram/hour tPA per catheter. FINDINGS: Pulmonary arterial hypertension. The main pulmonary arterial pressure is 41/22 with a mean of 30 mm Hg IMPRESSION: Successful initiation of bilateral catheter directed ultrasound assisted thrombolysis with EKOS. Signed, Sterling Big, MD [facility] Radiology Electronically Signed   By: Malachy Moan M.D.   On: 03/31/2016 20:59   Dg Chest Portable 1 View  Result Date: 03/31/2016 CLINICAL DATA:  Shortness of breath EXAM: PORTABLE CHEST 1 VIEW COMPARISON:  Portable exam 1731 hours without priors for comparison FINDINGS: Normal heart size, mediastinal contours, and pulmonary vascularity. Infiltrate identified at the lateral RIGHT lung base consistent with pneumonia. Remaining lungs clear. No pleural effusion or pneumothorax. Bones unremarkable. IMPRESSION: RIGHT basilar infiltrate consistent with pneumonia. Electronically Signed   By: Ulyses Southward M.D.   On: 03/31/2016 17:46   Ir Infusion Thrombol Arterial Initial (ms)  Result Date: 03/31/2016 INDICATION: 36 year old female with acute large volume bilateral pulmonary embolus and acute cor pulmonale. EXAM: IR INFUSION THROMBOL ARTERIAL INITIAL (MS); ADDITIONAL  ARTERIOGRAPHY; IR ULTRASOUND GUIDANCE VASC ACCESS RIGHT; BILATERAL PULMONARY ARTERIOGRAPHY COMPARISON:  CTA chest 03/31/2016 MEDICATIONS: None. ANESTHESIA/SEDATION: Versed 1 mg IV; Fentanyl 25 mcg IV Moderate Sedation Time:  30 minutes The patient was continuously monitored during the procedure by the interventional radiology nurse under my direct supervision. FLUOROSCOPY TIME:  Fluoroscopy Time: 14 minutes 0 seconds (40 mGy). COMPLICATIONS: None immediate. TECHNIQUE: Informed written consent was obtained from the patient after a thorough discussion of the procedural risks, benefits and alternatives. All questions were addressed. Maximal Sterile Barrier Technique was utilized including caps, mask, sterile gowns, sterile gloves, sterile drape, hand hygiene and skin antiseptic. A timeout was performed prior to the initiation of the procedure. The right common femoral vein was interrogated with ultrasound and found to be widely  patent. An image was obtained and stored for the medical record. Local anesthesia was attained by infiltration with 1% lidocaine. A small dermatotomy was made. Under real-time sonographic guidance, the vessel was punctured with a 21 gauge micropuncture needle. Using standard technique, the initial micro needle was exchanged over a 0.018 micro wire for a transitional 4 [place] micro sheath. The micro sheath was then exchanged over a 0.035 wire for a 6 French sheath. Using the same technique, a second right common femoral venous access was attained under ultrasound guidance and a second 6 [place] vascular sheath placed. A C2 cobra catheter was advanced over a Bentson wire through the more lateral sheath and advanced into the main pulmonary artery. A bilateral pulmonary arteriogram was performed. There is significant occlusive thrombus in the right main pulmonary artery extending into the right lower lobe pulmonary artery. Subocclusive thrombus is also present in the right upper lobar pulmonary  artery. There is occlusive thrombus in the left lower lobe pulmonary artery. Pulmonary arterial pressures were then obtained. The main pulmonary arterial pressure is 41/22 with a mean of 30 mm Hg. The C2 cobra catheter was then exchanged over a Rosen wire for a 100 cm vertebral catheter. Using a glidewire, the catheter was successfully navigated beyond the occlusive thrombus in the right lower lobe pulmonary artery. The Glidewire was then exchanged for a Rosen wire and a 12 cm EKOS infusion catheter advanced over the wire. The catheter was flushed. The C2 cobra catheter was then advanced over a Bentson wire through the more medial sheath in used to select the left pulmonary artery. In the catheter was advanced beyond the thrombus in the left lower lobe pulmonary artery. A Rosen wire was placed. The cobra catheter was removed and a 12 cm EKOS infusion catheter was advanced over the wire. The 6 French sheaths were secured to the skin with 2-0 Prolene. Arterial thrombolysis was initiated with 1 milligram/hour tPA per catheter. FINDINGS: Pulmonary arterial hypertension. The main pulmonary arterial pressure is 41/22 with a mean of 30 mm Hg IMPRESSION: Successful initiation of bilateral catheter directed ultrasound assisted thrombolysis with EKOS. Signed, Sterling Big, MD [facility] Radiology Electronically Signed   By: Malachy Moan M.D.   On: 03/31/2016 20:59   Ir Infusion Thrombol Arterial Initial (ms)  Result Date: 03/31/2016 INDICATION: 36 year old female with acute large volume bilateral pulmonary embolus and acute cor pulmonale. EXAM: IR INFUSION THROMBOL ARTERIAL INITIAL (MS); ADDITIONAL ARTERIOGRAPHY; IR ULTRASOUND GUIDANCE VASC ACCESS RIGHT; BILATERAL PULMONARY ARTERIOGRAPHY COMPARISON:  CTA chest 03/31/2016 MEDICATIONS: None. ANESTHESIA/SEDATION: Versed 1 mg IV; Fentanyl 25 mcg IV Moderate Sedation Time:  30 minutes The patient was continuously  monitored during the procedure by the interventional radiology nurse under my direct supervision. FLUOROSCOPY TIME:  Fluoroscopy Time: 14 minutes 0 seconds (40 mGy). COMPLICATIONS: None immediate. TECHNIQUE: Informed written consent was obtained from the patient after a thorough discussion of the procedural risks, benefits and alternatives. All questions were addressed. Maximal Sterile Barrier Technique was utilized including caps, mask, sterile gowns, sterile gloves, sterile drape, hand hygiene and skin antiseptic. A timeout was performed prior to the initiation of the procedure. The right common femoral vein was interrogated with ultrasound and found to be widely patent. An image was obtained and stored for the medical record. Local anesthesia was attained by infiltration with 1% lidocaine. A small dermatotomy was made. Under real-time sonographic guidance, the vessel was punctured with a 21 gauge micropuncture needle. Using standard technique, the initial  micro needle was exchanged over a 0.018 micro wire for a transitional 4 [place] micro sheath. The micro sheath was then exchanged over a 0.035 wire for a 6 French sheath. Using the same technique, a second right common femoral venous access was attained under ultrasound guidance and a second 6 [place] vascular sheath placed. A C2 cobra catheter was advanced over a Bentson wire through the more lateral sheath and advanced into the main pulmonary artery. A bilateral pulmonary arteriogram was performed. There is significant occlusive thrombus in the right main pulmonary artery extending into the right lower lobe pulmonary artery. Subocclusive thrombus is also present in the right upper lobar pulmonary artery. There is occlusive thrombus in the left lower lobe pulmonary artery. Pulmonary arterial pressures were then obtained. The main pulmonary arterial pressure is 41/22 with a mean of 30 mm Hg. The C2 cobra catheter was then exchanged over a Rosen wire for a 100 cm  vertebral catheter. Using a glidewire, the catheter was successfully navigated beyond the occlusive thrombus in the right lower lobe pulmonary artery. The Glidewire was then exchanged for a Rosen wire and a 12 cm EKOS infusion catheter advanced over the wire. The catheter was flushed. The C2 cobra catheter was then advanced over a Bentson wire through the more medial sheath in used to select the left pulmonary artery. In the catheter was advanced beyond the thrombus in the left lower lobe pulmonary artery. A Rosen wire was placed. The cobra catheter was removed and a 12 cm EKOS infusion catheter was advanced over the wire. The 6 French sheaths were secured to the skin with 2-0 Prolene. Arterial thrombolysis was initiated with 1 milligram/hour tPA per catheter. FINDINGS: Pulmonary arterial hypertension. The main pulmonary arterial pressure is 41/22 with a mean of 30 mm Hg IMPRESSION: Successful initiation of bilateral catheter directed ultrasound assisted thrombolysis with EKOS. Signed, Sterling Big, MD [facility] Radiology Electronically Signed   By: Malachy Moan M.D.   On: 03/31/2016 20:59    DISCUSSION: 36 y.o. F admitted 3/29 with acute bilateral PE.  Had ankle surgery in January and is on OCP's. Started on heparin and IR called for EKOS.  ASSESSMENT / PLAN:    ASSESSMENT and PLAN  Pulmonary embolism (HCC) Cause : OCP  Currently : improved has onoing IV heparin and systemic EKOS  Plan EKOS per rptocol IV heparin x 5 days before NOAC  Electrolyte imbalance Mild low mag  Plan Replete mag  Anemia Due to critical illness . No evidence of bleed  Plan - PRBC for hgb </= 6.9gm%    - exceptions are   -  if ACS susepcted/confirmed then transfuse for hgb </= 8.0gm%,  or    -  active bleeding with hemodynamic instability, then transfuse regardless of hemoglobin value   At at all times try to transfuse 1 unit prbc as possible  with exception of active hemorrhage    Thrombocytopenia (HCC) Acute in seting of PE and lysis and heparnio  Plan Monitor closely Continue anticoag for now  Acute respiratory failure with hypoxia (HCC) Was on bipap and this is due to PE. Cuirrently 2L [place] and no distress and pulse ox 100%  Plan Dc o2 and monitor Goal pulse ox > 88%      FAMILY  - Updates: 04/01/2016 --> patient updated  - Inter-disciplinary family meet or Palliative Care meeting due by:  DAy 7. Current LOS is LOS 1 days  CODE STATUS  Code Status Orders        Start     Ordered   03/31/16 2233  Full code  Continuous     03/31/16 2232    Code Status History    Date Active Date Inactive Code Status Order ID Comments User Context   03/31/2016  6:46 PM 03/31/2016 10:32 PM Full Code [id number]  Tobey Grim, NP ED        DISPO Keep in ICU       The patient is critically ill with multiple organ systems failure and requires high complexity decision making for assessment and support, frequent evaluation and titration of therapies, application of advanced monitoring technologies and extensive interpretation of multiple databases.   Critical Care Time devoted to patient care services described in this note is  30  Minutes. This time reflects time of care of this signee Dr Kalman Shan. This critical care time does not reflect procedure time, or teaching time or supervisory time of PA/NP/Med student/Med Resident etc but could involve care discussion time    Dr. Kalman Shan, M.D., [facility].C.P Pulmonary and Critical Care Medicine Staff Physician [facility] [place] Pulmonary and Critical Care Pager: [phone], If no answer or between  15:00h - 7:00h: call [phone]  04/01/2016 11:22 AM

## 2016-04-01 NOTE — Assessment & Plan Note (Signed)
Was on bipap and this is due to PE. Cuirrently 2L [place] and no distress and pulse ox 100%  Plan Dc o2 and monitor Goal pulse ox > 88%

## 2016-04-01 NOTE — Assessment & Plan Note (Signed)
Due to critical illness . No evidence of bleed  Plan - PRBC for hgb </= 6.9gm%    - exceptions are   -  if ACS susepcted/confirmed then transfuse for hgb </= 8.0gm%,  or    -  active bleeding with hemodynamic instability, then transfuse regardless of hemoglobin value   At at all times try to transfuse 1 unit prbc as possible with exception of active hemorrhage

## 2016-04-01 NOTE — Progress Notes (Signed)
ANTICOAGULATION CONSULT NOTE - Follow Up Consult  Pharmacy Consult for Heparin  Indication: pulmonary embolus  Allergies  Allergen Reactions  . Latex Rash    Patient Measurements: Height:  (180.3 cm) Weight: 158 lb 1.1 oz (71.7 kg) IBW/kg (Calculated) : 70.8  Vital Signs: Temp: 98.3 F (36.8 C) (03/29 2338) Temp Source: Oral (03/29 2338) BP: 110/79 (03/30 0145) Pulse Rate: 111 (03/30 0145)  Labs:  Recent Labs  03/31/16 1828 03/31/16 2233 04/01/16 0100  HGB 10.3* 9.5*  --   HCT 32.4* 29.2*  --   PLT 127* 122*  --   LABPROT 16.7*  --   --   INR 1.34  --   --   HEPARINUNFRC  --   --  0.79*  CREATININE 1.29*  --   --     Estimated Creatinine Clearance: 69.3 mL/min (A) (by C-G formula based on SCr of 1.29 mg/dL (H)).  Assessment: 36 y/o F with new onset bilateral PE, s/p ankle surgery ~1 month ago, currently on EKOS protocol, heparin level is elevated (drawn about 1 hour early). No issues per RN.   Goal of Therapy:  Heparin level 0.3-0.7 units/ml Monitor platelets by anticoagulation protocol: Yes   Plan:  -Decrease heparin to 1100 units/hr -0700 HL (currently being drawn q6h)  Samantha Friedman 04/01/2016,2:15 AM

## 2016-04-01 NOTE — Progress Notes (Signed)
ANTICOAGULATION CONSULT NOTE  Pharmacy Consult for heparin Indication: pulmonary embolus  Allergies  Allergen Reactions  . Latex Rash    Patient Measurements: Height:  (180.3 cm) Weight: 176 lb 1.6 oz (79.9 kg) IBW/kg (Calculated) : 70.8 Heparin Dosing Weight: 71.7kg  Vital Signs: Temp: 99.4 F (37.4 C) (03/30 1555) Temp Source: Oral (03/30 1555) BP: 112/84 (03/30 1700) Pulse Rate: 93 (03/30 1700)  Labs:  Recent Labs  03/31/16 1828 03/31/16 2233  04/01/16 0330 04/01/16 0345 04/01/16 0958 04/01/16 1025 04/01/16 1117 04/01/16 1731  HGB 10.3* 9.5*  --  9.1*  --  8.8*  --   --   --   HCT 32.4* 29.2*  --  27.1*  --  26.9*  --   --   --   PLT 127* 122*  --  107*  --  91*  --   --   --   LABPROT 16.7*  --   --   --   --   --   --   --   --   INR 1.34  --   --   --   --   --   --   --   --   HEPARINUNFRC  --   --   < >  --   --   --  0.57 0.54 0.54  CREATININE 1.29*  --   --   --  0.84  --   --   --   --   < > = values in this interval not displayed.  Estimated Creatinine Clearance: 106.5 mL/min (by C-G formula based on SCr of 0.84 mg/dL).   Medical History: Past Medical History:  Diagnosis Date  . Anemia   . Scoliosis     Medications:  Infusions:  . sodium chloride 125 mL/hr (03/31/16 1908)  . sodium chloride Stopped (04/01/16 1200)  . sodium chloride Stopped (04/01/16 1200)  . sodium chloride Stopped (04/01/16 1200)  . sodium chloride Stopped (04/01/16 1200)  . heparin 1,100 Units/hr (04/01/16 1304)    Assessment: 35 yof presented to the ED with SOB and syncope. Found to have an acute PE with right heart strain. Started on EKOS overnight, received tPA through bilateral pulmonary catheters - now completed with saline infusing, going to get catheters pulled this afternoon.   Heparin level therapeutic on 1100 units/hr unchanged from earlier today, hgb 8.8, plts 91, both hgb and plts down from baseline, fibrinogen stable.   Goal of Therapy:  Heparin  level 0.3-0.7 units/ml Monitor platelets by anticoagulation protocol: Yes   Plan:  Continue heparin at 1100 units/hr Daily heparin level and CBC F/u plan for oral anticoagulation  Sheppard Coil PharmD., BCPS Clinical Pharmacist Pager [phone] 04/01/2016 6:06 PM

## 2016-04-01 NOTE — Assessment & Plan Note (Signed)
Mild low mag  Plan Replete mag

## 2016-04-01 NOTE — Progress Notes (Addendum)
*  PRELIMINARY RESULTS* Vascular Ultrasound Bilateral lower extremity veous duplex has been completed.  Preliminary findings: The left lower extremity is positive for acute deep vein thrombosis from the proximal femoral vein, profunda and extending throughout the extremity into the calf.  The Left common femoral vein and greater saphenous are negative for DVT. No evidence of deep vein thrombosis in the right lower extremity. Negative for baker's cysts bilaterally.  Attempted to call preliminary results to attending listed (Dr. Tyson Alias), spoke with Dr. Sherene Sires- he is aware of LLE DVT @ 15:40.  Chauncey Fischer 04/01/2016, 3:32 PM

## 2016-04-01 NOTE — Progress Notes (Signed)
eLink Physician-Brief Progress Note Patient Name: Everlyn Farabaugh DOB: [birth year] MRN: [id number]   Date of Service  04/01/2016  HPI/Events of Note  Vascular lab reporting Pos LLE DVT/ pt s/p ekos and now on therapeutic hep  eICU Interventions  No change rx      Intervention Category Major Interventions: Other:  Sandrea Hughs 04/01/2016, 3:43 PM

## 2016-04-01 NOTE — Assessment & Plan Note (Signed)
Cause : OCP  Currently : improved has onoing IV heparin and systemic EKOS  Plan EKOS per rptocol IV heparin x 5 days before NOAC

## 2016-04-01 NOTE — Progress Notes (Signed)
ANTICOAGULATION CONSULT NOTE  Pharmacy Consult for heparin Indication: pulmonary embolus  Allergies  Allergen Reactions  . Latex Rash    Patient Measurements: Height:  (180.3 cm) Weight: 176 lb 1.6 oz (79.9 kg) IBW/kg (Calculated) : 70.8 Heparin Dosing Weight: 71.7kg  Vital Signs: Temp: 98 F (36.7 C) (03/30 0758) Temp Source: Oral (03/30 0758) BP: 111/78 (03/30 1000) Pulse Rate: 100 (03/30 1000)  Labs:  Recent Labs  03/31/16 1828 03/31/16 2233 04/01/16 0100 04/01/16 0330 04/01/16 0345 04/01/16 0958 04/01/16 1025  HGB 10.3* 9.5*  --  9.1*  --  8.8*  --   HCT 32.4* 29.2*  --  27.1*  --  26.9*  --   PLT 127* 122*  --  107*  --  91*  --   LABPROT 16.7*  --   --   --   --   --   --   INR 1.34  --   --   --   --   --   --   HEPARINUNFRC  --   --  0.79*  --   --   --  0.57  CREATININE 1.29*  --   --   --  0.84  --   --     Estimated Creatinine Clearance: 106.5 mL/min (by C-G formula based on SCr of 0.84 mg/dL).   Medical History: Past Medical History:  Diagnosis Date  . Anemia   . Scoliosis     Medications:  Infusions:  . sodium chloride 125 mL/hr (03/31/16 1908)  . sodium chloride    . sodium chloride    . sodium chloride 10 mL/hr at 04/01/16 0927  . sodium chloride 10 mL/hr at 04/01/16 0927  . heparin 1,100 Units/hr (04/01/16 0219)    Assessment: 35 yof presented to the ED with SOB and syncope. Found to have an acute PE with right heart strain. Started on EKOS overnight, received tPA through bilateral pulmonary catheters - now completed with saline infusing, going to get catheters pulled this afternoon. Heparin level therapeutic on 1100 units/hr, hgb 8.8, plts 91, both hgb and plts down from baseline, fibrinogen stable.   Goal of Therapy:  Heparin level 0.3-0.7 units/ml Monitor platelets by anticoagulation protocol: Yes    Plan:  Continue heparin at 1100 units/hr Check confirmatory level this afternoon Daily heparin level and CBC F/u plan  for oral anticoagulation    Agapito Games, PharmD, BCPS Clinical Pharmacist 04/01/2016 11:29 AM

## 2016-04-02 ENCOUNTER — Inpatient Hospital Stay (HOSPITAL_COMMUNITY): Payer: 59

## 2016-04-02 DIAGNOSIS — I2699 Other pulmonary embolism without acute cor pulmonale: Secondary | ICD-10-CM

## 2016-04-02 LAB — ECHOCARDIOGRAM COMPLETE
AVLVOTPG: 6 mmHg
CHL CUP TV REG PEAK VELOCITY: 273 cm/s
E decel time: 173 msec
EERAT: 5.34
FS: 34 % (ref 28–44)
Height: 71 in
IV/PV OW: 0.93
LA diam end sys: 29 mm
LA diam index: 1.49 cm/m2
LA vol A4C: 33.8 ml
LA vol: 43.8 mL
LASIZE: 29 mm
LAVOLIN: 22.5 mL/m2
LV TDI E'LATERAL: 18.9
LV e' LATERAL: 18.9 cm/s
LVEEAVG: 5.34
LVEEMED: 5.34
LVOT SV: 59 mL
LVOT VTI: 23.2 cm
LVOT area: 2.54 cm2
LVOT diameter: 18 mm
LVOTPV: 126 cm/s
Lateral S' vel: 12.4 cm/s
MV Dec: 173
MV pk A vel: 99.5 m/s
MVPG: 4 mmHg
MVPKEVEL: 101 m/s
PW: 7.53 mm — AB (ref 0.6–1.1)
RV sys press: 33 mmHg
TAPSE: 30.4 mm
TDI e' medial: 15.3
TR max vel: 273 cm/s
Weight: 2649.05 oz

## 2016-04-02 LAB — HEMOGLOBIN A1C
HEMOGLOBIN A1C: 5.5 % (ref 4.8–5.6)
MEAN PLASMA GLUCOSE: 111 mg/dL

## 2016-04-02 LAB — CBC
HCT: 25.2 % — ABNORMAL LOW (ref 36.0–46.0)
HEMOGLOBIN: 8.3 g/dL — AB (ref 12.0–15.0)
MCH: 30.6 pg (ref 26.0–34.0)
MCHC: 32.9 g/dL (ref 30.0–36.0)
MCV: 93 fL (ref 78.0–100.0)
PLATELETS: 114 10*3/uL — AB (ref 150–400)
RBC: 2.71 MIL/uL — AB (ref 3.87–5.11)
RDW: 13.1 % (ref 11.5–15.5)
WBC: 8.8 10*3/uL (ref 4.0–10.5)

## 2016-04-02 LAB — GLUCOSE, CAPILLARY
GLUCOSE-CAPILLARY: 88 mg/dL (ref 65–99)
GLUCOSE-CAPILLARY: 66 mg/dL (ref 65–99)
Glucose-Capillary: 110 mg/dL — ABNORMAL HIGH (ref 65–99)
Glucose-Capillary: 75 mg/dL (ref 65–99)

## 2016-04-02 LAB — HEPARIN LEVEL (UNFRACTIONATED): HEPARIN UNFRACTIONATED: 0.46 [IU]/mL (ref 0.30–0.70)

## 2016-04-02 NOTE — Progress Notes (Signed)
PULMONARY / CRITICAL CARE MEDICINE   Name: Samantha Friedman MRN: [id number] DOB: 07/15/1980    ADMISSION DATE:  03/31/2016 CONSULTATION DATE:  03/31/16  REFERRING MD:  Lewie Chamber - EDP  CHIEF COMPLAINT:  SOB  BRIEF Samantha Friedman is a 36 y.o. F with PMH of anemia and scoliosis.  She presented to [facility] ED 3/29 with sharp pain between shoulder blades, SOB, diaphoresis, syncopal episode. She was supposedly exercising on a exercise bike when she lost consciousness. On EMS arrival, she was hypotensive and hypoxic.   She was brought to ED with CT of the chest revealed massive PE (RV/LV = 1.6).  She was also in respiratory distress therefore was placed on BiPAP due to increased work of breathing.  PCCM was asked to admit to ICU and consider EKOS.  Given increased work of breathing and troponin bump, IR was consulted and will proceed with EKOS.  Of note, she had ankle surgery in January of this year for ruptured Achilles tendon. She is also on oral contraceptives. She has not had any recent travel, hemoptysis, history of malignancy, prior VTE.    STUDIES:  CTA Chest 3/29 > acute bilateral PE (RV / LV = 1.6) Echo 3/30 >  LE Duplex 3/30 >   CULTURES: None.  ANTIBIOTICS: None.  SIGNIFICANT EVENTS: 3/29 > admit.   EVENTS 3/29 - Remains on BIPAP. States that dyspnea has somewhat improved however feels like it is still hard to breath  3/30- ekos removed  SUBJECTIVE/OVERNIGHT/INTERVAL HX On RA Not tachy   VITAL SIGNS: BP 120/82   Pulse 89   Temp 98.2 F (36.8 C) (Oral)   Resp (!) 23   Ht 5\' 11"  (1.803 m)   Wt 75.1 kg (165 lb 9.1 oz)   SpO2 100%   BMI 23.09 kg/m   HEMODYNAMICS:    VENTILATOR SETTINGS:    INTAKE / OUTPUT: I/O last 3 completed shifts: In: 7054.3 [P.O.:360; I.V.:5644.3; IV Piggyback:1050] Out: 1725 [Urine:1725]  PHYSICAL EXAMINATION: General:awake alert, nonfocal Neuro: nonfocal, no dfistress HEENT: jvd wnl PULM: CTA CV:  s1 s2 RRR not tachy, no rt  heeve GIsoft bs wwnl no r: Extremities:  Edema lower legs left greater rt    LABS: PULMONARY No results for input(s): PHART, PCO2ART, PO2ART, HCO3, TCO2, O2SAT in the last 168 hours.  Invalid input(s): PCO2, PO2  CBC  Recent Labs Lab 04/01/16 0958 04/01/16 2031 04/02/16 0436  HGB 8.8* 8.6* 8.3*  HCT 26.9* 26.5* 25.2*  WBC 8.4 10.0 8.8  PLT 91* 104* 114*    COAGULATION  Recent Labs Lab 03/31/16 1828  INR 1.34    CARDIAC  No results for input(s): TROPONINI in the last 168 hours. No results for input(s): PROBNP in the last 168 hours.   CHEMISTRY  Recent Labs Lab 03/31/16 1828 04/01/16 0345  NA 139 138  K 3.8 4.1  CL 106 108  CO2 20* 20*  GLUCOSE 193* 104*  BUN 9 8  CREATININE 1.29* 0.84  CALCIUM 8.1* 7.9*  MG  --  1.8  PHOS  --  3.4   Estimated Creatinine Clearance: 106.5 mL/min (by C-G formula based on SCr of 0.84 mg/dL).   LIVER  Recent Labs Lab 03/31/16 1828  INR 1.34     INFECTIOUS  Recent Labs Lab 03/31/16 1834 03/31/16 2154  LATICACIDVEN 5.74* 1.6     ENDOCRINE CBG (last 3)   Recent Labs  04/02/16 0756 04/02/16 0847 04/02/16 1204  GLUCAP 66 110* 75  IMAGING x48h  - image(s) personally visualized  -   highlighted in bold Ct Angio Chest Pe W And/or Wo Contrast  Result Date: 03/31/2016 CLINICAL DATA:  Hypoxia, hypotension and syncope. Chest pain. No cough or fever. On birth control. Recent surgery 01/14/2016. EXAM: CT ANGIOGRAPHY CHEST WITH CONTRAST TECHNIQUE: Multidetector CT imaging of the chest was performed using the standard protocol during bolus administration of intravenous contrast. Multiplanar CT image reconstructions and MIPs were obtained to evaluate the vascular anatomy. CONTRAST:  100 cc Isovue 370 IV COMPARISON:  None. FINDINGS: Cardiovascular: Acute bilateral multilobar pulmonary emboli with the RV/LV ratio 1.6. Near saddle embolus with most of the thrombus seen in the right main pulmonary artery.  Cardiac chambers are normal in size. No pericardial effusion is identified. No aortic aneurysm or dissection. Mediastinum/Nodes: No enlarged mediastinal, hilar, or axillary lymph nodes. Thyroid gland, trachea, and esophagus demonstrate no significant findings. Lungs/Pleura: Parenchymal opacities in the periphery of the right lower lobe more likely represent pulmonary infarcts as opposed pneumonic consolidations given the clinical report of no fever and cough. Upper Abdomen: No acute abnormality. Musculoskeletal: Small cutaneous nodules along anterior lower chest wall. No acute or significant osseous findings. Review of the MIP images confirms the above findings. IMPRESSION: Positive for acute PE with CT evidence of right heart strain (RV/LV Ratio = 1.6) consistent with at least submassive (intermediate risk) PE. The presence of right heart strain has been associated with an increased risk of morbidity and mortality. Please activate Code PE by paging [phone]. Critical Value/emergent results were called by telephone at the time of interpretation on 03/31/2016 at 6:37 pm to Dr. Lynden Oxford , who verbally acknowledged these results. Peripheral right lower lobe pulmonary opacities consistent with pulmonary infarct as opposed pneumonic consolidations given lack of clinical symptoms for pneumonia after discussion with Dr. Rush Landmark. Cutaneous nodules of the anterior chest wall overlying the lower sternum. Electronically Signed   By: Tollie Eth M.D.   On: 03/31/2016 18:39   Ir Angiogram Pulmonary Bilateral Selective  Result Date: 03/31/2016 INDICATION: 36 year old female with acute large volume bilateral pulmonary embolus and acute cor pulmonale. EXAM: IR INFUSION THROMBOL ARTERIAL INITIAL (MS); ADDITIONAL ARTERIOGRAPHY; IR ULTRASOUND GUIDANCE VASC ACCESS RIGHT; BILATERAL PULMONARY ARTERIOGRAPHY COMPARISON:  CTA chest 03/31/2016 MEDICATIONS: None. ANESTHESIA/SEDATION: Versed 1 mg IV; Fentanyl 25 mcg IV  Moderate Sedation Time:  30 minutes The patient was continuously monitored during the procedure by the interventional radiology nurse under my direct supervision. FLUOROSCOPY TIME:  Fluoroscopy Time: 14 minutes 0 seconds (40 mGy). COMPLICATIONS: None immediate. TECHNIQUE: Informed written consent was obtained from the patient after a thorough discussion of the procedural risks, benefits and alternatives. All questions were addressed. Maximal Sterile Barrier Technique was utilized including caps, mask, sterile gowns, sterile gloves, sterile drape, hand hygiene and skin antiseptic. A timeout was performed prior to the initiation of the procedure. The right common femoral vein was interrogated with ultrasound and found to be widely patent. An image was obtained and stored for the medical record. Local anesthesia was attained by infiltration with 1% lidocaine. A small dermatotomy was made. Under real-time sonographic guidance, the vessel was punctured with a 21 gauge micropuncture needle. Using standard technique, the initial micro needle was exchanged over a 0.018 micro wire for a transitional 4 [place] micro sheath. The micro sheath was then exchanged over a 0.035 wire for a 6 French sheath. Using the same technique, a second right common femoral venous access was attained under ultrasound guidance and a second 6  [place] vascular sheath placed. A C2 cobra catheter was advanced over a Bentson wire through the more lateral sheath and advanced into the main pulmonary artery. A bilateral pulmonary arteriogram was performed. There is significant occlusive thrombus in the right main pulmonary artery extending into the right lower lobe pulmonary artery. Subocclusive thrombus is also present in the right upper lobar pulmonary artery. There is occlusive thrombus in the left lower lobe pulmonary artery. Pulmonary arterial pressures were then obtained. The main pulmonary arterial pressure is 41/22 with a mean of 30 mm Hg. The C2  cobra catheter was then exchanged over a Rosen wire for a 100 cm vertebral catheter. Using a glidewire, the catheter was successfully navigated beyond the occlusive thrombus in the right lower lobe pulmonary artery. The Glidewire was then exchanged for a Rosen wire and a 12 cm EKOS infusion catheter advanced over the wire. The catheter was flushed. The C2 cobra catheter was then advanced over a Bentson wire through the more medial sheath in used to select the left pulmonary artery. In the catheter was advanced beyond the thrombus in the left lower lobe pulmonary artery. A Rosen wire was placed. The cobra catheter was removed and a 12 cm EKOS infusion catheter was advanced over the wire. The 6 French sheaths were secured to the skin with 2-0 Prolene. Arterial thrombolysis was initiated with 1 milligram/hour tPA per catheter. FINDINGS: Pulmonary arterial hypertension. The main pulmonary arterial pressure is 41/22 with a mean of 30 mm Hg IMPRESSION: Successful initiation of bilateral catheter directed ultrasound assisted thrombolysis with EKOS. Signed, Sterling Big, MD [facility] Radiology Electronically Signed   By: Malachy Moan M.D.   On: 03/31/2016 20:59   Ir Angiogram Selective Each Additional Vessel  Result Date: 03/31/2016 INDICATION: 36 year old female with acute large volume bilateral pulmonary embolus and acute cor pulmonale. EXAM: IR INFUSION THROMBOL ARTERIAL INITIAL (MS); ADDITIONAL ARTERIOGRAPHY; IR ULTRASOUND GUIDANCE VASC ACCESS RIGHT; BILATERAL PULMONARY ARTERIOGRAPHY COMPARISON:  CTA chest 03/31/2016 MEDICATIONS: None. ANESTHESIA/SEDATION: Versed 1 mg IV; Fentanyl 25 mcg IV Moderate Sedation Time:  30 minutes The patient was continuously monitored during the procedure by the interventional radiology nurse under my direct supervision. FLUOROSCOPY TIME:  Fluoroscopy Time: 14 minutes 0 seconds (40 mGy). COMPLICATIONS: None immediate.  TECHNIQUE: Informed written consent was obtained from the patient after a thorough discussion of the procedural risks, benefits and alternatives. All questions were addressed. Maximal Sterile Barrier Technique was utilized including caps, mask, sterile gowns, sterile gloves, sterile drape, hand hygiene and skin antiseptic. A timeout was performed prior to the initiation of the procedure. The right common femoral vein was interrogated with ultrasound and found to be widely patent. An image was obtained and stored for the medical record. Local anesthesia was attained by infiltration with 1% lidocaine. A small dermatotomy was made. Under real-time sonographic guidance, the vessel was punctured with a 21 gauge micropuncture needle. Using standard technique, the initial micro needle was exchanged over a 0.018 micro wire for a transitional 4 [place] micro sheath. The micro sheath was then exchanged over a 0.035 wire for a 6 French sheath. Using the same technique, a second right common femoral venous access was attained under ultrasound guidance and a second 6 [place] vascular sheath placed. A C2 cobra catheter was advanced over a Bentson wire through the more lateral sheath and advanced into the main pulmonary artery. A bilateral pulmonary arteriogram was performed. There is significant occlusive thrombus in the right main pulmonary artery extending  into the right lower lobe pulmonary artery. Subocclusive thrombus is also present in the right upper lobar pulmonary artery. There is occlusive thrombus in the left lower lobe pulmonary artery. Pulmonary arterial pressures were then obtained. The main pulmonary arterial pressure is 41/22 with a mean of 30 mm Hg. The C2 cobra catheter was then exchanged over a Rosen wire for a 100 cm vertebral catheter. Using a glidewire, the catheter was successfully navigated beyond the occlusive thrombus in the right lower lobe pulmonary artery. The Glidewire was then exchanged for a Rosen  wire and a 12 cm EKOS infusion catheter advanced over the wire. The catheter was flushed. The C2 cobra catheter was then advanced over a Bentson wire through the more medial sheath in used to select the left pulmonary artery. In the catheter was advanced beyond the thrombus in the left lower lobe pulmonary artery. A Rosen wire was placed. The cobra catheter was removed and a 12 cm EKOS infusion catheter was advanced over the wire. The 6 French sheaths were secured to the skin with 2-0 Prolene. Arterial thrombolysis was initiated with 1 milligram/hour tPA per catheter. FINDINGS: Pulmonary arterial hypertension. The main pulmonary arterial pressure is 41/22 with a mean of 30 mm Hg IMPRESSION: Successful initiation of bilateral catheter directed ultrasound assisted thrombolysis with EKOS. Signed, Sterling Big, MD [facility] Radiology Electronically Signed   By: Malachy Moan M.D.   On: 03/31/2016 20:59   Ir Angiogram Selective Each Additional Vessel  Result Date: 03/31/2016 INDICATION: 36 year old female with acute large volume bilateral pulmonary embolus and acute cor pulmonale. EXAM: IR INFUSION THROMBOL ARTERIAL INITIAL (MS); ADDITIONAL ARTERIOGRAPHY; IR ULTRASOUND GUIDANCE VASC ACCESS RIGHT; BILATERAL PULMONARY ARTERIOGRAPHY COMPARISON:  CTA chest 03/31/2016 MEDICATIONS: None. ANESTHESIA/SEDATION: Versed 1 mg IV; Fentanyl 25 mcg IV Moderate Sedation Time:  30 minutes The patient was continuously monitored during the procedure by the interventional radiology nurse under my direct supervision. FLUOROSCOPY TIME:  Fluoroscopy Time: 14 minutes 0 seconds (40 mGy). COMPLICATIONS: None immediate. TECHNIQUE: Informed written consent was obtained from the patient after a thorough discussion of the procedural risks, benefits and alternatives. All questions were addressed. Maximal Sterile Barrier Technique was utilized including caps, mask, sterile gowns, sterile  gloves, sterile drape, hand hygiene and skin antiseptic. A timeout was performed prior to the initiation of the procedure. The right common femoral vein was interrogated with ultrasound and found to be widely patent. An image was obtained and stored for the medical record. Local anesthesia was attained by infiltration with 1% lidocaine. A small dermatotomy was made. Under real-time sonographic guidance, the vessel was punctured with a 21 gauge micropuncture needle. Using standard technique, the initial micro needle was exchanged over a 0.018 micro wire for a transitional 4 [place] micro sheath. The micro sheath was then exchanged over a 0.035 wire for a 6 French sheath. Using the same technique, a second right common femoral venous access was attained under ultrasound guidance and a second 6 [place] vascular sheath placed. A C2 cobra catheter was advanced over a Bentson wire through the more lateral sheath and advanced into the main pulmonary artery. A bilateral pulmonary arteriogram was performed. There is significant occlusive thrombus in the right main pulmonary artery extending into the right lower lobe pulmonary artery. Subocclusive thrombus is also present in the right upper lobar pulmonary artery. There is occlusive thrombus in the left lower lobe pulmonary artery. Pulmonary arterial pressures were then obtained. The main pulmonary arterial pressure is 41/22 with  a mean of 30 mm Hg. The C2 cobra catheter was then exchanged over a Rosen wire for a 100 cm vertebral catheter. Using a glidewire, the catheter was successfully navigated beyond the occlusive thrombus in the right lower lobe pulmonary artery. The Glidewire was then exchanged for a Rosen wire and a 12 cm EKOS infusion catheter advanced over the wire. The catheter was flushed. The C2 cobra catheter was then advanced over a Bentson wire through the more medial sheath in used to select the left pulmonary artery. In the catheter was advanced beyond the  thrombus in the left lower lobe pulmonary artery. A Rosen wire was placed. The cobra catheter was removed and a 12 cm EKOS infusion catheter was advanced over the wire. The 6 French sheaths were secured to the skin with 2-0 Prolene. Arterial thrombolysis was initiated with 1 milligram/hour tPA per catheter. FINDINGS: Pulmonary arterial hypertension. The main pulmonary arterial pressure is 41/22 with a mean of 30 mm Hg IMPRESSION: Successful initiation of bilateral catheter directed ultrasound assisted thrombolysis with EKOS. Signed, Sterling Big, MD [facility] Radiology Electronically Signed   By: Malachy Moan M.D.   On: 03/31/2016 20:59   Ir US Guide Vasc Access Right  Result Date: 03/31/2016 INDICATION: 36 year old female with acute large volume bilateral pulmonary embolus and acute cor pulmonale. EXAM: IR INFUSION THROMBOL ARTERIAL INITIAL (MS); ADDITIONAL ARTERIOGRAPHY; IR ULTRASOUND GUIDANCE VASC ACCESS RIGHT; BILATERAL PULMONARY ARTERIOGRAPHY COMPARISON:  CTA chest 03/31/2016 MEDICATIONS: None. ANESTHESIA/SEDATION: Versed 1 mg IV; Fentanyl 25 mcg IV Moderate Sedation Time:  30 minutes The patient was continuously monitored during the procedure by the interventional radiology nurse under my direct supervision. FLUOROSCOPY TIME:  Fluoroscopy Time: 14 minutes 0 seconds (40 mGy). COMPLICATIONS: None immediate. TECHNIQUE: Informed written consent was obtained from the patient after a thorough discussion of the procedural risks, benefits and alternatives. All questions were addressed. Maximal Sterile Barrier Technique was utilized including caps, mask, sterile gowns, sterile gloves, sterile drape, hand hygiene and skin antiseptic. A timeout was performed prior to the initiation of the procedure. The right common femoral vein was interrogated with ultrasound and found to be widely patent. An image was obtained and stored for the medical record. Local  anesthesia was attained by infiltration with 1% lidocaine. A small dermatotomy was made. Under real-time sonographic guidance, the vessel was punctured with a 21 gauge micropuncture needle. Using standard technique, the initial micro needle was exchanged over a 0.018 micro wire for a transitional 4 [place] micro sheath. The micro sheath was then exchanged over a 0.035 wire for a 6 French sheath. Using the same technique, a second right common femoral venous access was attained under ultrasound guidance and a second 6 [place] vascular sheath placed. A C2 cobra catheter was advanced over a Bentson wire through the more lateral sheath and advanced into the main pulmonary artery. A bilateral pulmonary arteriogram was performed. There is significant occlusive thrombus in the right main pulmonary artery extending into the right lower lobe pulmonary artery. Subocclusive thrombus is also present in the right upper lobar pulmonary artery. There is occlusive thrombus in the left lower lobe pulmonary artery. Pulmonary arterial pressures were then obtained. The main pulmonary arterial pressure is 41/22 with a mean of 30 mm Hg. The C2 cobra catheter was then exchanged over a Rosen wire for a 100 cm vertebral catheter. Using a glidewire, the catheter was successfully navigated beyond the occlusive thrombus in the right lower lobe pulmonary artery. The Glidewire  was then exchanged for a Rosen wire and a 12 cm EKOS infusion catheter advanced over the wire. The catheter was flushed. The C2 cobra catheter was then advanced over a Bentson wire through the more medial sheath in used to select the left pulmonary artery. In the catheter was advanced beyond the thrombus in the left lower lobe pulmonary artery. A Rosen wire was placed. The cobra catheter was removed and a 12 cm EKOS infusion catheter was advanced over the wire. The 6 French sheaths were secured to the skin with 2-0 Prolene. Arterial thrombolysis was initiated with 1  milligram/hour tPA per catheter. FINDINGS: Pulmonary arterial hypertension. The main pulmonary arterial pressure is 41/22 with a mean of 30 mm Hg IMPRESSION: Successful initiation of bilateral catheter directed ultrasound assisted thrombolysis with EKOS. Signed, Sterling Big, MD [facility] Radiology Electronically Signed   By: Malachy Moan M.D.   On: 03/31/2016 20:59   Dg Chest Portable 1 View  Result Date: 03/31/2016 CLINICAL DATA:  Shortness of breath EXAM: PORTABLE CHEST 1 VIEW COMPARISON:  Portable exam 1731 hours without priors for comparison FINDINGS: Normal heart size, mediastinal contours, and pulmonary vascularity. Infiltrate identified at the lateral RIGHT lung base consistent with pneumonia. Remaining lungs clear. No pleural effusion or pneumothorax. Bones unremarkable. IMPRESSION: RIGHT basilar infiltrate consistent with pneumonia. Electronically Signed   By: Ulyses Southward M.D.   On: 03/31/2016 17:46   Ir Infusion Thrombol Arterial Initial (ms)  Result Date: 03/31/2016 INDICATION: 36 year old female with acute large volume bilateral pulmonary embolus and acute cor pulmonale. EXAM: IR INFUSION THROMBOL ARTERIAL INITIAL (MS); ADDITIONAL ARTERIOGRAPHY; IR ULTRASOUND GUIDANCE VASC ACCESS RIGHT; BILATERAL PULMONARY ARTERIOGRAPHY COMPARISON:  CTA chest 03/31/2016 MEDICATIONS: None. ANESTHESIA/SEDATION: Versed 1 mg IV; Fentanyl 25 mcg IV Moderate Sedation Time:  30 minutes The patient was continuously monitored during the procedure by the interventional radiology nurse under my direct supervision. FLUOROSCOPY TIME:  Fluoroscopy Time: 14 minutes 0 seconds (40 mGy). COMPLICATIONS: None immediate. TECHNIQUE: Informed written consent was obtained from the patient after a thorough discussion of the procedural risks, benefits and alternatives. All questions were addressed. Maximal Sterile Barrier Technique was utilized including caps, mask, sterile  gowns, sterile gloves, sterile drape, hand hygiene and skin antiseptic. A timeout was performed prior to the initiation of the procedure. The right common femoral vein was interrogated with ultrasound and found to be widely patent. An image was obtained and stored for the medical record. Local anesthesia was attained by infiltration with 1% lidocaine. A small dermatotomy was made. Under real-time sonographic guidance, the vessel was punctured with a 21 gauge micropuncture needle. Using standard technique, the initial micro needle was exchanged over a 0.018 micro wire for a transitional 4 [place] micro sheath. The micro sheath was then exchanged over a 0.035 wire for a 6 French sheath. Using the same technique, a second right common femoral venous access was attained under ultrasound guidance and a second 6 [place] vascular sheath placed. A C2 cobra catheter was advanced over a Bentson wire through the more lateral sheath and advanced into the main pulmonary artery. A bilateral pulmonary arteriogram was performed. There is significant occlusive thrombus in the right main pulmonary artery extending into the right lower lobe pulmonary artery. Subocclusive thrombus is also present in the right upper lobar pulmonary artery. There is occlusive thrombus in the left lower lobe pulmonary artery. Pulmonary arterial pressures were then obtained. The main pulmonary arterial pressure is 41/22 with a mean of 30 mm  Hg. The C2 cobra catheter was then exchanged over a Rosen wire for a 100 cm vertebral catheter. Using a glidewire, the catheter was successfully navigated beyond the occlusive thrombus in the right lower lobe pulmonary artery. The Glidewire was then exchanged for a Rosen wire and a 12 cm EKOS infusion catheter advanced over the wire. The catheter was flushed. The C2 cobra catheter was then advanced over a Bentson wire through the more medial sheath in used to select the left pulmonary artery. In the catheter was advanced  beyond the thrombus in the left lower lobe pulmonary artery. A Rosen wire was placed. The cobra catheter was removed and a 12 cm EKOS infusion catheter was advanced over the wire. The 6 French sheaths were secured to the skin with 2-0 Prolene. Arterial thrombolysis was initiated with 1 milligram/hour tPA per catheter. FINDINGS: Pulmonary arterial hypertension. The main pulmonary arterial pressure is 41/22 with a mean of 30 mm Hg IMPRESSION: Successful initiation of bilateral catheter directed ultrasound assisted thrombolysis with EKOS. Signed, Sterling Big, MD [facility] Radiology Electronically Signed   By: Malachy Moan M.D.   On: 03/31/2016 20:59   Ir Infusion Thrombol Arterial Initial (ms)  Result Date: 03/31/2016 INDICATION: 36 year old female with acute large volume bilateral pulmonary embolus and acute cor pulmonale. EXAM: IR INFUSION THROMBOL ARTERIAL INITIAL (MS); ADDITIONAL ARTERIOGRAPHY; IR ULTRASOUND GUIDANCE VASC ACCESS RIGHT; BILATERAL PULMONARY ARTERIOGRAPHY COMPARISON:  CTA chest 03/31/2016 MEDICATIONS: None. ANESTHESIA/SEDATION: Versed 1 mg IV; Fentanyl 25 mcg IV Moderate Sedation Time:  30 minutes The patient was continuously monitored during the procedure by the interventional radiology nurse under my direct supervision. FLUOROSCOPY TIME:  Fluoroscopy Time: 14 minutes 0 seconds (40 mGy). COMPLICATIONS: None immediate. TECHNIQUE: Informed written consent was obtained from the patient after a thorough discussion of the procedural risks, benefits and alternatives. All questions were addressed. Maximal Sterile Barrier Technique was utilized including caps, mask, sterile gowns, sterile gloves, sterile drape, hand hygiene and skin antiseptic. A timeout was performed prior to the initiation of the procedure. The right common femoral vein was interrogated with ultrasound and found to be widely patent. An image was obtained and stored for the  medical record. Local anesthesia was attained by infiltration with 1% lidocaine. A small dermatotomy was made. Under real-time sonographic guidance, the vessel was punctured with a 21 gauge micropuncture needle. Using standard technique, the initial micro needle was exchanged over a 0.018 micro wire for a transitional 4 [place] micro sheath. The micro sheath was then exchanged over a 0.035 wire for a 6 French sheath. Using the same technique, a second right common femoral venous access was attained under ultrasound guidance and a second 6 [place] vascular sheath placed. A C2 cobra catheter was advanced over a Bentson wire through the more lateral sheath and advanced into the main pulmonary artery. A bilateral pulmonary arteriogram was performed. There is significant occlusive thrombus in the right main pulmonary artery extending into the right lower lobe pulmonary artery. Subocclusive thrombus is also present in the right upper lobar pulmonary artery. There is occlusive thrombus in the left lower lobe pulmonary artery. Pulmonary arterial pressures were then obtained. The main pulmonary arterial pressure is 41/22 with a mean of 30 mm Hg. The C2 cobra catheter was then exchanged over a Rosen wire for a 100 cm vertebral catheter. Using a glidewire, the catheter was successfully navigated beyond the occlusive thrombus in the right lower lobe pulmonary artery. The Glidewire was then exchanged for a  Rosen wire and a 12 cm EKOS infusion catheter advanced over the wire. The catheter was flushed. The C2 cobra catheter was then advanced over a Bentson wire through the more medial sheath in used to select the left pulmonary artery. In the catheter was advanced beyond the thrombus in the left lower lobe pulmonary artery. A Rosen wire was placed. The cobra catheter was removed and a 12 cm EKOS infusion catheter was advanced over the wire. The 6 French sheaths were secured to the skin with 2-0 Prolene. Arterial thrombolysis was  initiated with 1 milligram/hour tPA per catheter. FINDINGS: Pulmonary arterial hypertension. The main pulmonary arterial pressure is 41/22 with a mean of 30 mm Hg IMPRESSION: Successful initiation of bilateral catheter directed ultrasound assisted thrombolysis with EKOS. Signed, Sterling Big, MD [facility] Electronically Signed   By: Malachy Moan M.D.   On: 03/31/2016 20:59   Ir Rande Lawman F/u Eval Art/ven Basil Dess Day (ms)  Result Date: 04/01/2016 CLINICAL DATA:  Acute large volume bilateral pulmonary emboli, acute cor pulmonale, status post overnight catheter directed ultrasound assisted bilateral pulmonary artery thrombolytic confusion. Patient is clinically improved. EXAM: IR THROMB F/U EVAL ART/VEN SUBSEQ DAY (MS) ANESTHESIA/SEDATION: None MEDICATIONS: None CONTRAST:  None PROCEDURE: Bedside pulmonary arterial pressure measurements were obtained. The catheters and venous sheaths were removed and hemostasis achieved with manual compression. COMPLICATIONS: None immediate FINDINGS: Post treatment pulmonary arterial pressure 28/23 (26) mmHg (initially 41/22 (30) mmHg). IMPRESSION: 1. Decrease in systolic and mean pulmonary artery pressures post overnight bilateral pulmonary arterial catheter directed ultrasound assisted thrombolytic infusion. Thrombolysis was discontinued and the catheters removed uneventfully. Electronically Signed   By: Corlis Leak M.D.   On: 04/01/2016 15:41    DISCUSSION: 35 y.o. F admitted 3/29 with acute bilateral PE.  Had ankle surgery in January and is on OCP's. Started on heparin and IR called for EKOS.  ASSESSMENT / PLAN:    ASSESSMENT and PLAN ASSESSMENT / PLAN:  Acute bilateral PE - appears provoked given that the patient is on oral contraceptives. Also had recent ankle surgery in January of this year. Troponin bump - due to above. Hypotension - due to above.  Resolved ATn resolved  The left lower  extremity is positive for acute deep vein thrombosis from the proximal femoral vein, profunda and extending throughout the extremity into the calf.  The Left common femoral vein and greater saphenous are negative for DVT. No evidence of deep vein thrombosis in the right lower extremity. Negative for baker's cysts bilaterally.  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 Tyson Alias, MD, FACP Pgr: [phone]  Pulmonary & Critical Care

## 2016-04-02 NOTE — Progress Notes (Signed)
ANTICOAGULATION CONSULT NOTE  Pharmacy Consult for heparin Indication: pulmonary embolus  Allergies  Allergen Reactions  . Latex Rash    Patient Measurements: Height:  (180.3 cm) Weight: 165 lb 9.1 oz (75.1 kg) IBW/kg (Calculated) : 70.8 Heparin Dosing Weight: 71.7kg  Vital Signs: Temp: 98 F (36.7 C) (03/31 0754) Temp Source: Oral (03/31 0754) BP: 120/82 (03/31 1100) Pulse Rate: 89 (03/31 1100)  Labs:  Recent Labs  03/31/16 1828  04/01/16 0345 04/01/16 0958  04/01/16 1117 04/01/16 1731 04/01/16 2031 04/02/16 0435 04/02/16 0436  HGB 10.3*  < >  --  8.8*  --   --   --  8.6*  --  8.3*  HCT 32.4*  < >  --  26.9*  --   --   --  26.5*  --  25.2*  PLT 127*  < >  --  91*  --   --   --  104*  --  114*  LABPROT 16.7*  --   --   --   --   --   --   --   --   --   INR 1.34  --   --   --   --   --   --   --   --   --   HEPARINUNFRC  --   < >  --   --   < > 0.54 0.54  --  0.46  --   CREATININE 1.29*  --  0.84  --   --   --   --   --   --   --   < > = values in this interval not displayed.  Estimated Creatinine Clearance: 106.5 mL/min (by C-G formula based on SCr of 0.84 mg/dL).   Medical History: Past Medical History:  Diagnosis Date  . Anemia   . Scoliosis     Medications:  Infusions:  . sodium chloride 125 mL/hr at 04/02/16 0302  . sodium chloride Stopped (04/01/16 1200)  . sodium chloride Stopped (04/01/16 1200)  . sodium chloride Stopped (04/01/16 1200)  . sodium chloride Stopped (04/01/16 1200)  . heparin 1,100 Units/hr (04/01/16 1304)    Assessment: 35 yof presented to the ED with SOB and syncope. Found to have an acute PE with right heart strain.s/p EKOS overnight, received tPA through bilateral pulmonary catheters. Heparin level therapeutic on 1100 units/hr, hgb 8.3, plts 114, both on low end but stable.   Goal of Therapy:  Heparin level 0.3-0.7 units/ml Monitor platelets by anticoagulation protocol: Yes    Plan:  Continue heparin at 1100  units/hr Daily heparin level and CBC F/u plan for oral anticoagulation    Agapito Games, PharmD, BCPS Clinical Pharmacist 04/02/2016 11:44 AM

## 2016-04-02 NOTE — Progress Notes (Signed)
Blood glucose was 66, given glucose sourae, recheck 110. Pt A/O, asymptomatic

## 2016-04-02 NOTE — Progress Notes (Signed)
Pt arrived to unit via bed from 26M.  Telemetry applied and CCMD notified x 2.  Pt oriented to room including call light and telephone.  Pt plan of care discussed with Pt.  Pt indicates understanding and is in agreement with POC.  Pt no s/s of distress.  Will cont to monitor.

## 2016-04-02 NOTE — Progress Notes (Signed)
  Echocardiogram 2D Echocardiogram has been performed.  Samantha Friedman 04/02/2016, 2:34 PM

## 2016-04-02 NOTE — Progress Notes (Signed)
Referring Physician(s): Dr Algis Downs Brooks Sailors  Supervising Physician: Oley Balm  Patient Status:  [facility] - In-pt  Chief Complaint:  B PE +  Rt Heart strain Ekos Thrombolysis 3/29  Subjective:  Doing really well today Breathing easily No complaints  3/30 Vasc study: Findings consistent with acute deep vein thrombosis involving the   left lower extremity. Now Heparin drip   Allergies: Latex  Medications: Prior to Admission medications   Medication Sig Start Date End Date Taking? Authorizing Provider  ibuprofen (ADVIL,MOTRIN) 800 MG tablet TAKE 1 TABLET(800 MG) BY MOUTH EVERY 8 HOURS AS NEEDED Patient taking differently: Take 800 mg by mouth every 8 hours as needed for pain 03/21/16  Yes Kathryne Hitch, MD  oxyCODONE-acetaminophen (PERCOCET/ROXICET) 5-325 MG tablet Take 1-2 tablets by mouth every 4 (four) hours as needed for severe pain. 01/14/16  Yes Kathryne Hitch, MD  TRINESSA, 28, 0.18/0.215/0.25 MG-35 MCG tablet TAKE 1 TABLET BY MOUTH EVERY DAY 01/15/16  Yes Gwenlyn Found Copland, MD  PREVIDENT 5000 PLUS 1.1 % CREA dental cream Place 1 application onto teeth at bedtime. 10/26/15   Historical Provider, MD     Vital Signs: BP 125/83   Pulse 92   Temp 98 F (36.7 C) (Oral)   Resp (!) 23   Ht  (1.803 m)   Wt 165 lb 9.1 oz (75.1 kg)   SpO2 100%   BMI 23.09 kg/m   Physical Exam  Constitutional: She is oriented to person, place, and time. She appears well-nourished.  Cardiovascular: Normal rate and regular rhythm.   Pulmonary/Chest: Effort normal and breath sounds normal.  Abdominal: Soft.  Musculoskeletal: Normal range of motion.  Neurological: She is alert and oriented to person, place, and time.  Skin: Skin is warm and dry.  Rt groin NT no bleeding No hematoma Rt foot 2+ pulses   Psychiatric: She has a normal mood and affect. Her behavior is normal.  Nursing note and vitals reviewed.   Imaging: Ct Angio Chest Pe W And/or Wo  Contrast  Result Date: 03/31/2016 CLINICAL DATA:  Hypoxia, hypotension and syncope. Chest pain. No cough or fever. On birth control. Recent surgery 01/14/2016. EXAM: CT ANGIOGRAPHY CHEST WITH CONTRAST TECHNIQUE: Multidetector CT imaging of the chest was performed using the standard protocol during bolus administration of intravenous contrast. Multiplanar CT image reconstructions and MIPs were obtained to evaluate the vascular anatomy. CONTRAST:  100 cc Isovue 370 IV COMPARISON:  None. FINDINGS: Cardiovascular: Acute bilateral multilobar pulmonary emboli with the RV/LV ratio 1.6. Near saddle embolus with most of the thrombus seen in the right main pulmonary artery. Cardiac chambers are normal in size. No pericardial effusion is identified. No aortic aneurysm or dissection. Mediastinum/Nodes: No enlarged mediastinal, hilar, or axillary lymph nodes. Thyroid gland, trachea, and esophagus demonstrate no significant findings. Lungs/Pleura: Parenchymal opacities in the periphery of the right lower lobe more likely represent pulmonary infarcts as opposed pneumonic consolidations given the clinical report of no fever and cough. Upper Abdomen: No acute abnormality. Musculoskeletal: Small cutaneous nodules along anterior lower chest wall. No acute or significant osseous findings. Review of the MIP images confirms the above findings. IMPRESSION: Positive for acute PE with CT evidence of right heart strain (RV/LV Ratio = 1.6) consistent with at least submassive (intermediate risk) PE. The presence of right heart strain has been associated with an increased risk of morbidity and mortality. Please activate Code PE by paging [phone]. Critical Value/emergent results were called by telephone at the time of interpretation  on 03/31/2016 at 6:37 pm to Dr. Lynden Oxford , who verbally acknowledged these results. Peripheral right lower lobe pulmonary opacities consistent with pulmonary infarct as opposed pneumonic  consolidations given lack of clinical symptoms for pneumonia after discussion with Dr. Rush Landmark. Cutaneous nodules of the anterior chest wall overlying the lower sternum. Electronically Signed   By: Tollie Eth M.D.   On: 03/31/2016 18:39   Ir Angiogram Pulmonary Bilateral Selective  Result Date: 03/31/2016 INDICATION: 36 year old female with acute large volume bilateral pulmonary embolus and acute cor pulmonale. EXAM: IR INFUSION THROMBOL ARTERIAL INITIAL (MS); ADDITIONAL ARTERIOGRAPHY; IR ULTRASOUND GUIDANCE VASC ACCESS RIGHT; BILATERAL PULMONARY ARTERIOGRAPHY COMPARISON:  CTA chest 03/31/2016 MEDICATIONS: None. ANESTHESIA/SEDATION: Versed 1 mg IV; Fentanyl 25 mcg IV Moderate Sedation Time:  30 minutes The patient was continuously monitored during the procedure by the interventional radiology nurse under my direct supervision. FLUOROSCOPY TIME:  Fluoroscopy Time: 14 minutes 0 seconds (40 mGy). COMPLICATIONS: None immediate. TECHNIQUE: Informed written consent was obtained from the patient after a thorough discussion of the procedural risks, benefits and alternatives. All questions were addressed. Maximal Sterile Barrier Technique was utilized including caps, mask, sterile gowns, sterile gloves, sterile drape, hand hygiene and skin antiseptic. A timeout was performed prior to the initiation of the procedure. The right common femoral vein was interrogated with ultrasound and found to be widely patent. An image was obtained and stored for the medical record. Local anesthesia was attained by infiltration with 1% lidocaine. A small dermatotomy was made. Under real-time sonographic guidance, the vessel was punctured with a 21 gauge micropuncture needle. Using standard technique, the initial micro needle was exchanged over a 0.018 micro wire for a transitional 4 [place] micro sheath. The micro sheath was then exchanged over a 0.035 wire for a 6 French sheath. Using the same technique, a second right common femoral  venous access was attained under ultrasound guidance and a second 6 [place] vascular sheath placed. A C2 cobra catheter was advanced over a Bentson wire through the more lateral sheath and advanced into the main pulmonary artery. A bilateral pulmonary arteriogram was performed. There is significant occlusive thrombus in the right main pulmonary artery extending into the right lower lobe pulmonary artery. Subocclusive thrombus is also present in the right upper lobar pulmonary artery. There is occlusive thrombus in the left lower lobe pulmonary artery. Pulmonary arterial pressures were then obtained. The main pulmonary arterial pressure is 41/22 with a mean of 30 mm Hg. The C2 cobra catheter was then exchanged over a Rosen wire for a 100 cm vertebral catheter. Using a glidewire, the catheter was successfully navigated beyond the occlusive thrombus in the right lower lobe pulmonary artery. The Glidewire was then exchanged for a Rosen wire and a 12 cm EKOS infusion catheter advanced over the wire. The catheter was flushed. The C2 cobra catheter was then advanced over a Bentson wire through the more medial sheath in used to select the left pulmonary artery. In the catheter was advanced beyond the thrombus in the left lower lobe pulmonary artery. A Rosen wire was placed. The cobra catheter was removed and a 12 cm EKOS infusion catheter was advanced over the wire. The 6 French sheaths were secured to the skin with 2-0 Prolene. Arterial thrombolysis was initiated with 1 milligram/hour tPA per catheter. FINDINGS: Pulmonary arterial hypertension. The main pulmonary arterial pressure is 41/22 with a mean of 30 mm Hg IMPRESSION: Successful initiation of bilateral catheter directed ultrasound assisted thrombolysis with EKOS. Signed, Sterling Big, MD  [facility] Electronically Signed   By: Malachy Moan M.D.   On: 03/31/2016 20:59   Ir Angiogram Selective  Each Additional Vessel  Result Date: 03/31/2016 INDICATION: 36 year old female with acute large volume bilateral pulmonary embolus and acute cor pulmonale. EXAM: IR INFUSION THROMBOL ARTERIAL INITIAL (MS); ADDITIONAL ARTERIOGRAPHY; IR ULTRASOUND GUIDANCE VASC ACCESS RIGHT; BILATERAL PULMONARY ARTERIOGRAPHY COMPARISON:  CTA chest 03/31/2016 MEDICATIONS: None. ANESTHESIA/SEDATION: Versed 1 mg IV; Fentanyl 25 mcg IV Moderate Sedation Time:  30 minutes The patient was continuously monitored during the procedure by the interventional radiology nurse under my direct supervision. FLUOROSCOPY TIME:  Fluoroscopy Time: 14 minutes 0 seconds (40 mGy). COMPLICATIONS: None immediate. TECHNIQUE: Informed written consent was obtained from the patient after a thorough discussion of the procedural risks, benefits and alternatives. All questions were addressed. Maximal Sterile Barrier Technique was utilized including caps, mask, sterile gowns, sterile gloves, sterile drape, hand hygiene and skin antiseptic. A timeout was performed prior to the initiation of the procedure. The right common femoral vein was interrogated with ultrasound and found to be widely patent. An image was obtained and stored for the medical record. Local anesthesia was attained by infiltration with 1% lidocaine. A small dermatotomy was made. Under real-time sonographic guidance, the vessel was punctured with a 21 gauge micropuncture needle. Using standard technique, the initial micro needle was exchanged over a 0.018 micro wire for a transitional 4 [place] micro sheath. The micro sheath was then exchanged over a 0.035 wire for a 6 French sheath. Using the same technique, a second right common femoral venous access was attained under ultrasound guidance and a second 6 [place] vascular sheath placed. A C2 cobra catheter was advanced over a Bentson wire through the more lateral sheath and advanced into the main pulmonary artery. A bilateral pulmonary arteriogram was  performed. There is significant occlusive thrombus in the right main pulmonary artery extending into the right lower lobe pulmonary artery. Subocclusive thrombus is also present in the right upper lobar pulmonary artery. There is occlusive thrombus in the left lower lobe pulmonary artery. Pulmonary arterial pressures were then obtained. The main pulmonary arterial pressure is 41/22 with a mean of 30 mm Hg. The C2 cobra catheter was then exchanged over a Rosen wire for a 100 cm vertebral catheter. Using a glidewire, the catheter was successfully navigated beyond the occlusive thrombus in the right lower lobe pulmonary artery. The Glidewire was then exchanged for a Rosen wire and a 12 cm EKOS infusion catheter advanced over the wire. The catheter was flushed. The C2 cobra catheter was then advanced over a Bentson wire through the more medial sheath in used to select the left pulmonary artery. In the catheter was advanced beyond the thrombus in the left lower lobe pulmonary artery. A Rosen wire was placed. The cobra catheter was removed and a 12 cm EKOS infusion catheter was advanced over the wire. The 6 French sheaths were secured to the skin with 2-0 Prolene. Arterial thrombolysis was initiated with 1 milligram/hour tPA per catheter. FINDINGS: Pulmonary arterial hypertension. The main pulmonary arterial pressure is 41/22 with a mean of 30 mm Hg IMPRESSION: Successful initiation of bilateral catheter directed ultrasound assisted thrombolysis with EKOS. Signed, Sterling Big, MD [facility] Radiology Electronically Signed   By: Malachy Moan M.D.   On: 03/31/2016 20:59   Ir Angiogram Selective Each Additional Vessel  Result Date: 03/31/2016 INDICATION: 36 year old female with acute large volume bilateral pulmonary embolus and  acute cor pulmonale. EXAM: IR INFUSION THROMBOL ARTERIAL INITIAL (MS); ADDITIONAL ARTERIOGRAPHY; IR ULTRASOUND GUIDANCE VASC ACCESS  RIGHT; BILATERAL PULMONARY ARTERIOGRAPHY COMPARISON:  CTA chest 03/31/2016 MEDICATIONS: None. ANESTHESIA/SEDATION: Versed 1 mg IV; Fentanyl 25 mcg IV Moderate Sedation Time:  30 minutes The patient was continuously monitored during the procedure by the interventional radiology nurse under my direct supervision. FLUOROSCOPY TIME:  Fluoroscopy Time: 14 minutes 0 seconds (40 mGy). COMPLICATIONS: None immediate. TECHNIQUE: Informed written consent was obtained from the patient after a thorough discussion of the procedural risks, benefits and alternatives. All questions were addressed. Maximal Sterile Barrier Technique was utilized including caps, mask, sterile gowns, sterile gloves, sterile drape, hand hygiene and skin antiseptic. A timeout was performed prior to the initiation of the procedure. The right common femoral vein was interrogated with ultrasound and found to be widely patent. An image was obtained and stored for the medical record. Local anesthesia was attained by infiltration with 1% lidocaine. A small dermatotomy was made. Under real-time sonographic guidance, the vessel was punctured with a 21 gauge micropuncture needle. Using standard technique, the initial micro needle was exchanged over a 0.018 micro wire for a transitional 4 [place] micro sheath. The micro sheath was then exchanged over a 0.035 wire for a 6 French sheath. Using the same technique, a second right common femoral venous access was attained under ultrasound guidance and a second 6 [place] vascular sheath placed. A C2 cobra catheter was advanced over a Bentson wire through the more lateral sheath and advanced into the main pulmonary artery. A bilateral pulmonary arteriogram was performed. There is significant occlusive thrombus in the right main pulmonary artery extending into the right lower lobe pulmonary artery. Subocclusive thrombus is also present in the right upper lobar pulmonary artery. There is occlusive thrombus in the left lower  lobe pulmonary artery. Pulmonary arterial pressures were then obtained. The main pulmonary arterial pressure is 41/22 with a mean of 30 mm Hg. The C2 cobra catheter was then exchanged over a Rosen wire for a 100 cm vertebral catheter. Using a glidewire, the catheter was successfully navigated beyond the occlusive thrombus in the right lower lobe pulmonary artery. The Glidewire was then exchanged for a Rosen wire and a 12 cm EKOS infusion catheter advanced over the wire. The catheter was flushed. The C2 cobra catheter was then advanced over a Bentson wire through the more medial sheath in used to select the left pulmonary artery. In the catheter was advanced beyond the thrombus in the left lower lobe pulmonary artery. A Rosen wire was placed. The cobra catheter was removed and a 12 cm EKOS infusion catheter was advanced over the wire. The 6 French sheaths were secured to the skin with 2-0 Prolene. Arterial thrombolysis was initiated with 1 milligram/hour tPA per catheter. FINDINGS: Pulmonary arterial hypertension. The main pulmonary arterial pressure is 41/22 with a mean of 30 mm Hg IMPRESSION: Successful initiation of bilateral catheter directed ultrasound assisted thrombolysis with EKOS. Signed, Sterling Big, MD [facility] Radiology Electronically Signed   By: Malachy Moan M.D.   On: 03/31/2016 20:59   Ir US Guide Vasc Access Right  Result Date: 03/31/2016 INDICATION: 36 year old female with acute large volume bilateral pulmonary embolus and acute cor pulmonale. EXAM: IR INFUSION THROMBOL ARTERIAL INITIAL (MS); ADDITIONAL ARTERIOGRAPHY; IR ULTRASOUND GUIDANCE VASC ACCESS RIGHT; BILATERAL PULMONARY ARTERIOGRAPHY COMPARISON:  CTA chest 03/31/2016 MEDICATIONS: None. ANESTHESIA/SEDATION: Versed 1 mg IV; Fentanyl 25 mcg IV Moderate Sedation Time:  30 minutes The  patient was continuously monitored during the procedure by the interventional radiology nurse  under my direct supervision. FLUOROSCOPY TIME:  Fluoroscopy Time: 14 minutes 0 seconds (40 mGy). COMPLICATIONS: None immediate. TECHNIQUE: Informed written consent was obtained from the patient after a thorough discussion of the procedural risks, benefits and alternatives. All questions were addressed. Maximal Sterile Barrier Technique was utilized including caps, mask, sterile gowns, sterile gloves, sterile drape, hand hygiene and skin antiseptic. A timeout was performed prior to the initiation of the procedure. The right common femoral vein was interrogated with ultrasound and found to be widely patent. An image was obtained and stored for the medical record. Local anesthesia was attained by infiltration with 1% lidocaine. A small dermatotomy was made. Under real-time sonographic guidance, the vessel was punctured with a 21 gauge micropuncture needle. Using standard technique, the initial micro needle was exchanged over a 0.018 micro wire for a transitional 4 [place] micro sheath. The micro sheath was then exchanged over a 0.035 wire for a 6 French sheath. Using the same technique, a second right common femoral venous access was attained under ultrasound guidance and a second 6 [place] vascular sheath placed. A C2 cobra catheter was advanced over a Bentson wire through the more lateral sheath and advanced into the main pulmonary artery. A bilateral pulmonary arteriogram was performed. There is significant occlusive thrombus in the right main pulmonary artery extending into the right lower lobe pulmonary artery. Subocclusive thrombus is also present in the right upper lobar pulmonary artery. There is occlusive thrombus in the left lower lobe pulmonary artery. Pulmonary arterial pressures were then obtained. The main pulmonary arterial pressure is 41/22 with a mean of 30 mm Hg. The C2 cobra catheter was then exchanged over a Rosen wire for a 100 cm vertebral catheter. Using a glidewire, the catheter was successfully  navigated beyond the occlusive thrombus in the right lower lobe pulmonary artery. The Glidewire was then exchanged for a Rosen wire and a 12 cm EKOS infusion catheter advanced over the wire. The catheter was flushed. The C2 cobra catheter was then advanced over a Bentson wire through the more medial sheath in used to select the left pulmonary artery. In the catheter was advanced beyond the thrombus in the left lower lobe pulmonary artery. A Rosen wire was placed. The cobra catheter was removed and a 12 cm EKOS infusion catheter was advanced over the wire. The 6 French sheaths were secured to the skin with 2-0 Prolene. Arterial thrombolysis was initiated with 1 milligram/hour tPA per catheter. FINDINGS: Pulmonary arterial hypertension. The main pulmonary arterial pressure is 41/22 with a mean of 30 mm Hg IMPRESSION: Successful initiation of bilateral catheter directed ultrasound assisted thrombolysis with EKOS. Signed, Sterling Big, MD [facility] Radiology Electronically Signed   By: Malachy Moan M.D.   On: 03/31/2016 20:59   Dg Chest Portable 1 View  Result Date: 03/31/2016 CLINICAL DATA:  Shortness of breath EXAM: PORTABLE CHEST 1 VIEW COMPARISON:  Portable exam 1731 hours without priors for comparison FINDINGS: Normal heart size, mediastinal contours, and pulmonary vascularity. Infiltrate identified at the lateral RIGHT lung base consistent with pneumonia. Remaining lungs clear. No pleural effusion or pneumothorax. Bones unremarkable. IMPRESSION: RIGHT basilar infiltrate consistent with pneumonia. Electronically Signed   By: Ulyses Southward M.D.   On: 03/31/2016 17:46   Ir Infusion Thrombol Arterial Initial (ms)  Result Date: 03/31/2016 INDICATION: 36 year old female with acute large volume bilateral pulmonary embolus and acute cor pulmonale. EXAM: IR  INFUSION THROMBOL ARTERIAL INITIAL (MS); ADDITIONAL ARTERIOGRAPHY; IR ULTRASOUND GUIDANCE VASC  ACCESS RIGHT; BILATERAL PULMONARY ARTERIOGRAPHY COMPARISON:  CTA chest 03/31/2016 MEDICATIONS: None. ANESTHESIA/SEDATION: Versed 1 mg IV; Fentanyl 25 mcg IV Moderate Sedation Time:  30 minutes The patient was continuously monitored during the procedure by the interventional radiology nurse under my direct supervision. FLUOROSCOPY TIME:  Fluoroscopy Time: 14 minutes 0 seconds (40 mGy). COMPLICATIONS: None immediate. TECHNIQUE: Informed written consent was obtained from the patient after a thorough discussion of the procedural risks, benefits and alternatives. All questions were addressed. Maximal Sterile Barrier Technique was utilized including caps, mask, sterile gowns, sterile gloves, sterile drape, hand hygiene and skin antiseptic. A timeout was performed prior to the initiation of the procedure. The right common femoral vein was interrogated with ultrasound and found to be widely patent. An image was obtained and stored for the medical record. Local anesthesia was attained by infiltration with 1% lidocaine. A small dermatotomy was made. Under real-time sonographic guidance, the vessel was punctured with a 21 gauge micropuncture needle. Using standard technique, the initial micro needle was exchanged over a 0.018 micro wire for a transitional 4 [place] micro sheath. The micro sheath was then exchanged over a 0.035 wire for a 6 French sheath. Using the same technique, a second right common femoral venous access was attained under ultrasound guidance and a second 6 [place] vascular sheath placed. A C2 cobra catheter was advanced over a Bentson wire through the more lateral sheath and advanced into the main pulmonary artery. A bilateral pulmonary arteriogram was performed. There is significant occlusive thrombus in the right main pulmonary artery extending into the right lower lobe pulmonary artery. Subocclusive thrombus is also present in the right upper lobar pulmonary artery. There is occlusive thrombus in the left  lower lobe pulmonary artery. Pulmonary arterial pressures were then obtained. The main pulmonary arterial pressure is 41/22 with a mean of 30 mm Hg. The C2 cobra catheter was then exchanged over a Rosen wire for a 100 cm vertebral catheter. Using a glidewire, the catheter was successfully navigated beyond the occlusive thrombus in the right lower lobe pulmonary artery. The Glidewire was then exchanged for a Rosen wire and a 12 cm EKOS infusion catheter advanced over the wire. The catheter was flushed. The C2 cobra catheter was then advanced over a Bentson wire through the more medial sheath in used to select the left pulmonary artery. In the catheter was advanced beyond the thrombus in the left lower lobe pulmonary artery. A Rosen wire was placed. The cobra catheter was removed and a 12 cm EKOS infusion catheter was advanced over the wire. The 6 French sheaths were secured to the skin with 2-0 Prolene. Arterial thrombolysis was initiated with 1 milligram/hour tPA per catheter. FINDINGS: Pulmonary arterial hypertension. The main pulmonary arterial pressure is 41/22 with a mean of 30 mm Hg IMPRESSION: Successful initiation of bilateral catheter directed ultrasound assisted thrombolysis with EKOS. Signed, Sterling Big, MD [facility] Radiology Electronically Signed   By: Malachy Moan M.D.   On: 03/31/2016 20:59   Ir Infusion Thrombol Arterial Initial (ms)  Result Date: 03/31/2016 INDICATION: 36 year old female with acute large volume bilateral pulmonary embolus and acute cor pulmonale. EXAM: IR INFUSION THROMBOL ARTERIAL INITIAL (MS); ADDITIONAL ARTERIOGRAPHY; IR ULTRASOUND GUIDANCE VASC ACCESS RIGHT; BILATERAL PULMONARY ARTERIOGRAPHY COMPARISON:  CTA chest 03/31/2016 MEDICATIONS: None. ANESTHESIA/SEDATION: Versed 1 mg IV; Fentanyl 25 mcg IV Moderate Sedation Time:  30 minutes The patient was continuously monitored during  the procedure by the  interventional radiology nurse under my direct supervision. FLUOROSCOPY TIME:  Fluoroscopy Time: 14 minutes 0 seconds (40 mGy). COMPLICATIONS: None immediate. TECHNIQUE: Informed written consent was obtained from the patient after a thorough discussion of the procedural risks, benefits and alternatives. All questions were addressed. Maximal Sterile Barrier Technique was utilized including caps, mask, sterile gowns, sterile gloves, sterile drape, hand hygiene and skin antiseptic. A timeout was performed prior to the initiation of the procedure. The right common femoral vein was interrogated with ultrasound and found to be widely patent. An image was obtained and stored for the medical record. Local anesthesia was attained by infiltration with 1% lidocaine. A small dermatotomy was made. Under real-time sonographic guidance, the vessel was punctured with a 21 gauge micropuncture needle. Using standard technique, the initial micro needle was exchanged over a 0.018 micro wire for a transitional 4 [place] micro sheath. The micro sheath was then exchanged over a 0.035 wire for a 6 French sheath. Using the same technique, a second right common femoral venous access was attained under ultrasound guidance and a second 6 [place] vascular sheath placed. A C2 cobra catheter was advanced over a Bentson wire through the more lateral sheath and advanced into the main pulmonary artery. A bilateral pulmonary arteriogram was performed. There is significant occlusive thrombus in the right main pulmonary artery extending into the right lower lobe pulmonary artery. Subocclusive thrombus is also present in the right upper lobar pulmonary artery. There is occlusive thrombus in the left lower lobe pulmonary artery. Pulmonary arterial pressures were then obtained. The main pulmonary arterial pressure is 41/22 with a mean of 30 mm Hg. The C2 cobra catheter was then exchanged over a Rosen wire for a 100 cm vertebral catheter. Using a glidewire,  the catheter was successfully navigated beyond the occlusive thrombus in the right lower lobe pulmonary artery. The Glidewire was then exchanged for a Rosen wire and a 12 cm EKOS infusion catheter advanced over the wire. The catheter was flushed. The C2 cobra catheter was then advanced over a Bentson wire through the more medial sheath in used to select the left pulmonary artery. In the catheter was advanced beyond the thrombus in the left lower lobe pulmonary artery. A Rosen wire was placed. The cobra catheter was removed and a 12 cm EKOS infusion catheter was advanced over the wire. The 6 French sheaths were secured to the skin with 2-0 Prolene. Arterial thrombolysis was initiated with 1 milligram/hour tPA per catheter. FINDINGS: Pulmonary arterial hypertension. The main pulmonary arterial pressure is 41/22 with a mean of 30 mm Hg IMPRESSION: Successful initiation of bilateral catheter directed ultrasound assisted thrombolysis with EKOS. Signed, Sterling Big, MD [facility] Radiology Electronically Signed   By: Malachy Moan M.D.   On: 03/31/2016 20:59   Ir Rande Lawman F/u Eval Art/ven Basil Dess Day (ms)  Result Date: 04/01/2016 CLINICAL DATA:  Acute large volume bilateral pulmonary emboli, acute cor pulmonale, status post overnight catheter directed ultrasound assisted bilateral pulmonary artery thrombolytic confusion. Patient is clinically improved. EXAM: IR THROMB F/U EVAL ART/VEN SUBSEQ DAY (MS) ANESTHESIA/SEDATION: None MEDICATIONS: None CONTRAST:  None PROCEDURE: Bedside pulmonary arterial pressure measurements were obtained. The catheters and venous sheaths were removed and hemostasis achieved with manual compression. COMPLICATIONS: None immediate FINDINGS: Post treatment pulmonary arterial pressure 28/23 (26) mmHg (initially 41/22 (30) mmHg). IMPRESSION: 1. Decrease in systolic and mean pulmonary artery pressures post overnight bilateral pulmonary  arterial catheter directed ultrasound assisted thrombolytic  infusion. Thrombolysis was discontinued and the catheters removed uneventfully. Electronically Signed   By: Corlis Leak M.D.   On: 04/01/2016 15:41    Labs:  CBC:  Recent Labs  04/01/16 0330 04/01/16 0958 04/01/16 2031 04/02/16 0436  WBC 8.2 8.4 10.0 8.8  HGB 9.1* 8.8* 8.6* 8.3*  HCT 27.1* 26.9* 26.5* 25.2*  PLT 107* 91* 104* 114*    COAGS:  Recent Labs  03/31/16 1828  INR 1.34    BMP:  Recent Labs  03/31/16 1828 04/01/16 0345  NA 139 138  K 3.8 4.1  CL 106 108  CO2 20* 20*  GLUCOSE 193* 104*  BUN 9 8  CALCIUM 8.1* 7.9*  CREATININE 1.29* 0.84  GFRNONAA 54* >60  GFRAA >60 >60    LIVER FUNCTION TESTS: No results for input(s): BILITOT, AST, ALT, ALKPHOS, PROT, ALBUMIN in the last 8760 hours.  Assessment and Plan:  B PE/LLE DVT Ekos thrombolysis 3/29---doing very well Breathing easily Plan per CCM  Electronically Signed: Miroslav Gin A 04/02/2016, 11:11 AM   I spent a total of 15 Minutes at the the patient's bedside AND on the patient's hospital floor or unit, greater than 50% of which was counseling/coordinating care for B PE Ekos thrombolysis

## 2016-04-03 DIAGNOSIS — E878 Other disorders of electrolyte and fluid balance, not elsewhere classified: Secondary | ICD-10-CM

## 2016-04-03 DIAGNOSIS — D696 Thrombocytopenia, unspecified: Secondary | ICD-10-CM

## 2016-04-03 DIAGNOSIS — D649 Anemia, unspecified: Secondary | ICD-10-CM

## 2016-04-03 DIAGNOSIS — Z79899 Other long term (current) drug therapy: Secondary | ICD-10-CM

## 2016-04-03 LAB — CBC
HCT: 26.9 % — ABNORMAL LOW (ref 36.0–46.0)
Hemoglobin: 8.9 g/dL — ABNORMAL LOW (ref 12.0–15.0)
MCH: 30.8 pg (ref 26.0–34.0)
MCHC: 33.1 g/dL (ref 30.0–36.0)
MCV: 93.1 fL (ref 78.0–100.0)
PLATELETS: 145 10*3/uL — AB (ref 150–400)
RBC: 2.89 MIL/uL — AB (ref 3.87–5.11)
RDW: 13.2 % (ref 11.5–15.5)
WBC: 9.8 10*3/uL (ref 4.0–10.5)

## 2016-04-03 LAB — HEPARIN LEVEL (UNFRACTIONATED): Heparin Unfractionated: 0.25 IU/mL — ABNORMAL LOW (ref 0.30–0.70)

## 2016-04-03 MED ORDER — FUROSEMIDE 10 MG/ML IJ SOLN
40.0000 mg | Freq: Once | INTRAMUSCULAR | Status: AC
  Administered 2016-04-03: 40 mg via INTRAVENOUS
  Filled 2016-04-03: qty 4

## 2016-04-03 MED ORDER — APIXABAN 5 MG PO TABS: 5.0000 mg | ORAL_TABLET | Freq: Two times a day (BID) | ORAL | Status: DC

## 2016-04-03 MED ORDER — APIXABAN 5 MG PO TABS
10.0000 mg | ORAL_TABLET | Freq: Two times a day (BID) | ORAL | Status: DC
Start: 2016-04-03 — End: 2016-04-04
  Administered 2016-04-03 – 2016-04-04 (×3): 10 mg via ORAL
  Filled 2016-04-03 (×3): qty 2

## 2016-04-03 NOTE — Discharge Summary (Deleted)
PROGRESS NOTE  Samantha Friedman  ZOX:[id number] DOB: 06-Feb-1980 DOA: 03/31/2016 PCP: No PCP Per Patient Outpatient Specialists:  Subjective: Seen with family at bedside, denies any complaints, asked for the Foley catheter to be out.   Brief Narrative:  Samantha Friedman is a 36 y.o. F with PMH of anemia and scoliosis.  She presented to [facility] ED 3/29 with sharp pain between shoulder blades, SOB, diaphoresis, syncopal episode. She was supposedly exercising on a exercise bike when she lost consciousness. On EMS arrival, she was hypotensive and hypoxic.   She was brought to ED with CT of the chest revealed massive PE (RV/LV = 1.6). She was also in respiratory distress therefore was placed on BiPAP due to increased work of breathing.  PCCM was asked to admit to ICU and consider EKOS. Given increased work of breathing and troponin bump, IR was consulted and will proceed with EKOS.  Of note, she had ankle surgery in January of this year for ruptured Achilles tendon. She is also on oral contraceptives. She has not had any recent travel, hemoptysis, history of malignancy or prior VTE.  Assessment & Plan:   Principal Problem:   Pulmonary embolism (HCC) Active Problems:   Electrolyte imbalance   Anemia   Thrombocytopenia (HCC)   Acute respiratory failure with hypoxia (HCC)   Acute submassive pulmonary embolism -Patient presented to hospital with DOE, chest pain and syncope. -CT scan showed submassive PE, admitted initially to PCCM. -EKOS done by IR on 03/31/2016 -She was on heparin drip, switched to Eliquis on 04/03/2016.  Acute respiratory failure with hypoxia -Had severe respiratory distress, placed on BiPAP/Venturi mask on admission. -This is resolved, currently oxygen saturation in the high 90s on room air.  DVT -Level of the left lower extremity, likely provoked after surgery and immobility. -Patient is also on OCP. -Continue anticoagulation, use compression stockings to avoid post  phlebitic syndrome  Thrombocytopenia -Likely consumption thrombocytopenia, this is improving, platelets today's 45  Elevated troponin -Slightly elevated troponin 0.35, this is likely secondary to right heart strain from the submassive PE. No ACS.  Gross hematuria -Did have a Foley catheter till this morning, Foley catheter removed, not sure this is secondary to minor injury. -Patient also on Eliquis, monitor closely.  Anemia, acute, unspecified -Hemoglobin baseline from January of this year is 12.1, presented with hemoglobin of 10.36 admission. -Hemoglobin dropped to 8.9, monitor overnight, patient will be on Eliquis closely monitor as outpatient.  Lower extremity edema -Has mild fluid overload secondary to excessive IV fluids, IV fluids at 125 mL/hour, discontinued. -Give 1 dose of Lasix, check BMP in a.m.   DVT prophylaxis: On Eliquis Code Status: Full Code Family Communication:  Disposition Plan:  Diet: Diet regular Room service appropriate? Yes; Fluid consistency: Thin  Consultants:   Was under PCCM, [facility] picked up on 04/03/2016  Procedures:   None  Antimicrobials:   None  Objective: Vitals:   04/02/16 1400 04/02/16 1500 04/02/16 2028 04/03/16 0426  BP: 133/88 135/82 126/79 135/80  Pulse: 94 (!) 107 88 75  Resp: (!) 27 (!) Temp:  98.7 F (37.1 C) 98.2 F (36.8 C) 97.9 F (36.6 C)  TempSrc:  Oral Oral Oral  SpO2: 100% 100% 99% 100%  Weight:    78.4 kg (172 lb 14.4 oz)  Height:        Intake/Output Summary (Last 24 hours) at 04/03/16 1146 Last data filed at 04/03/16 0330  Gross per 24 hour  Intake  528 ml  Output             3850 ml  Net            -3322 ml   Filed Weights   04/01/16 0411 04/02/16 0500 04/03/16 0426  Weight: 79.9 kg (176 lb 1.6 oz) 75.1 kg (165 lb 9.1 oz) 78.4 kg (172 lb 14.4 oz)    Examination: General exam: Appears calm and comfortable  Respiratory system: Clear to auscultation. Respiratory effort  normal. Cardiovascular system: S1 & S2 heard, RRR. No JVD, murmurs, rubs, gallops or clicks. No pedal edema. Gastrointestinal system: Abdomen is nondistended, soft and nontender. No organomegaly or masses felt. Normal bowel sounds heard. Central nervous system: Alert and oriented. No focal neurological deficits. Extremities: Symmetric 5 x 5 power. Skin: No rashes, lesions or ulcers Psychiatry: Judgement and insight appear normal. Mood & affect appropriate.   Data Reviewed: I have personally reviewed following labs and imaging studies  CBC:  Recent Labs Lab 03/31/16 1828  04/01/16 0330 04/01/16 0958 04/01/16 2031 04/02/16 0436 04/03/16 0206  WBC 11.1*  < > 8.2 8.4 10.0 8.8 9.8  NEUTROABS 8.6*  --   --   --   --   --   --   HGB 10.3*  < > 9.1* 8.8* 8.6* 8.3* 8.9*  HCT 32.4*  < > 27.1* 26.9* 26.5* 25.2* 26.9*  MCV 95.3  < > 91.9 92.8 92.7 93.0 93.1  PLT 127*  < > 107* 91* 104* 114* 145*  < > = values in this interval not displayed. Basic Metabolic Panel:  Recent Labs Lab 03/31/16 1828 04/01/16 0345  NA 139 138  K 3.8 4.1  CL 106 108  CO2 20* 20*  GLUCOSE 193* 104*  BUN 9 8  CREATININE 1.29* 0.84  CALCIUM 8.1* 7.9*  MG  --  1.8  PHOS  --  3.4   GFR: Estimated Creatinine Clearance: 106.5 mL/min (by C-G formula based on SCr of 0.84 mg/dL). Liver Function Tests: No results for input(s): AST, ALT, ALKPHOS, BILITOT, PROT, ALBUMIN in the last 168 hours. No results for input(s): LIPASE, AMYLASE in the last 168 hours. No results for input(s): AMMONIA in the last 168 hours. Coagulation Profile:  Recent Labs Lab 03/31/16 1828  INR 1.34   Cardiac Enzymes: No results for input(s): CKTOTAL, CKMB, CKMBINDEX, TROPONINI in the last 168 hours. BNP (last 3 results) No results for input(s): PROBNP in the last 8760 hours. HbA1C:  Recent Labs  04/01/16 0100  HGBA1C 5.5   CBG:  Recent Labs Lab 04/01/16 2336 04/02/16 0355 04/02/16 0756 04/02/16 0847 04/02/16 1204   GLUCAP 84 88 66 110* 75   Lipid Profile: No results for input(s): CHOL, HDL, LDLCALC, TRIG, CHOLHDL, LDLDIRECT in the last 72 hours. Thyroid Function Tests: No results for input(s): TSH, T4TOTAL, FREET4, T3FREE, THYROIDAB in the last 72 hours. Anemia Panel: No results for input(s): VITAMINB12, FOLATE, FERRITIN, TIBC, IRON, RETICCTPCT in the last 72 hours. Urine analysis: No results found for: COLORURINE, APPEARANCEUR, LABSPEC, PHURINE, GLUCOSEU, HGBUR, BILIRUBINUR, KETONESUR, PROTEINUR, UROBILINOGEN, NITRITE, LEUKOCYTESUR Sepsis Labs: (procalcitonin:4,lacticidven:4)  ) Recent Results (from the past 240 hour(s))  MRSA PCR Screening     Status: None   Collection Time: 03/31/16 10:50 PM  Result Value Ref Range Status   MRSA by PCR NEGATIVE NEGATIVE Final    Comment:        The GeneXpert MRSA Assay (FDA approved for NASAL specimens only), is one component of a comprehensive MRSA colonization  surveillance program. It is not intended to diagnose MRSA infection nor to guide or monitor treatment for MRSA infections.      Invalid input(s): PROCALCITONIN, LACTICACIDVEN   Radiology Studies: Ir Rande Lawman F/u Eval Art/ven Subseq Day (ms)  Result Date: 04/01/2016 CLINICAL DATA:  Acute large volume bilateral pulmonary emboli, acute cor pulmonale, status post overnight catheter directed ultrasound assisted bilateral pulmonary artery thrombolytic confusion. Patient is clinically improved. EXAM: IR THROMB F/U EVAL ART/VEN SUBSEQ DAY (MS) ANESTHESIA/SEDATION: None MEDICATIONS: None CONTRAST:  None PROCEDURE: Bedside pulmonary arterial pressure measurements were obtained. The catheters and venous sheaths were removed and hemostasis achieved with manual compression. COMPLICATIONS: None immediate FINDINGS: Post treatment pulmonary arterial pressure 28/23 (26) mmHg (initially 41/22 (30) mmHg). IMPRESSION: 1. Decrease in systolic and mean pulmonary artery pressures post overnight bilateral  pulmonary arterial catheter directed ultrasound assisted thrombolytic infusion. Thrombolysis was discontinued and the catheters removed uneventfully. Electronically Signed   By: Corlis Leak M.D.   On: 04/01/2016 15:41        Scheduled Meds: . apixaban  10 mg Oral BID   Followed by  . [START ON 04/10/2016] apixaban  5 mg Oral BID   Continuous Infusions:   LOS: 3 days    Time spent: 35 minutes    Eriverto Byrnes A, MD [facility] Pager [phone]  If 7PM-7AM, please contact night-coverage [email or web address] Password [email or web address] 04/03/2016, 11:46 AM

## 2016-04-03 NOTE — Progress Notes (Signed)
ANTICOAGULATION CONSULT NOTE - Follow Up Consult  Pharmacy Consult for Heparin >> Apixaban Indication: pulmonary embolus  Allergies  Allergen Reactions  . Latex Rash    Patient Measurements: Height:  (180.3 cm) Weight: 172 lb 14.4 oz (78.4 kg) IBW/kg (Calculated) : 70.8  Vital Signs: Temp: 97.9 F (36.6 C) (04/01 0426) Temp Source: Oral (04/01 0426) BP: 135/80 (04/01 0426) Pulse Rate: 75 (04/01 0426)  Labs:  Recent Labs  03/31/16 1828  04/01/16 0345  04/01/16 1731 04/01/16 2031 04/02/16 0435 04/02/16 0436 04/03/16 0206  HGB 10.3*  < >  --   < >  --  8.6*  --  8.3* 8.9*  HCT 32.4*  < >  --   < >  --  26.5*  --  25.2* 26.9*  PLT 127*  < >  --   < >  --  104*  --  114* 145*  LABPROT 16.7*  --   --   --   --   --   --   --   --   INR 1.34  --   --   --   --   --   --   --   --   HEPARINUNFRC  --   < >  --   < > 0.54  --  0.46  --  0.25*  CREATININE 1.29*  --  0.84  --   --   --   --   --   --   < > = values in this interval not displayed.  Estimated Creatinine Clearance: 106.5 mL/min (by C-G formula based on SCr of 0.84 mg/dL).  Assessment: 36 y/o F with new onset bilateral PE, s/p ankle surgery ~1 month ago, s/p EKOS protocol currently on heparin to transition to apixaban.  Goal of Therapy:  Monitor platelets by anticoagulation protocol: Yes   Plan:  -D/C heparin drip -Initiate apixaban  PO BID x7 days then  PO BID -Watch renal function  Fredonia Highland, PharmD PGY-1 Pharmacy Resident Pager: [phone] 04/03/2016

## 2016-04-03 NOTE — Progress Notes (Signed)
PROGRESS NOTE  Samantha Friedman  ZOX:[id number] DOB: 05/25/1980 DOA: 03/31/2016 PCP: No PCP Per Patient Outpatient Specialists:  Subjective: Seen with family at bedside, denies any complaints, asked for the Foley catheter to be out.   Brief Narrative:  Samantha Friedman is a 36 y.o. F with PMH of anemia and scoliosis.  She presented to [facility] ED 3/29 with sharp pain between shoulder blades, SOB, diaphoresis, syncopal episode. She was supposedly exercising on a exercise bike when she lost consciousness. On EMS arrival, she was hypotensive and hypoxic.  She was brought to ED with CT of the chest revealed massive PE (RV/LV = 1.6). She was also in respiratory distress therefore was placed on BiPAP due to increased work of breathing.  PCCM was asked to admit to ICU and consider EKOS. Given increased work of breathing and troponin bump, IR was consulted and will proceed with EKOS.  Of note, she had ankle surgery in January of this year for ruptured Achilles tendon. She is also on oral contraceptives. She has not had any recent travel, hemoptysis, history of malignancy or prior VTE.  Assessment & Plan:   Principal Problem:   Pulmonary embolism (HCC) Active Problems:   Electrolyte imbalance   Anemia   Thrombocytopenia (HCC)   Acute respiratory failure with hypoxia (HCC)   Acute submassive pulmonary embolism -Patient presented to hospital with DOE, chest pain and syncope. -CT scan showed submassive PE, admitted initially to PCCM. -EKOS done by IR on 03/31/2016 -She was on heparin drip, switched to Eliquis on 04/03/2016.  Acute respiratory failure with hypoxia -Had severe respiratory distress, placed on BiPAP/Venturi mask on admission. -This is resolved, currently oxygen saturation in the high 90s on room air.  DVT -Level of the left lower extremity, likely provoked after surgery and immobility. -Patient is also on OCP. -Continue anticoagulation, use compression stockings to avoid post  phlebitic syndrome  Thrombocytopenia -Likely consumption thrombocytopenia, this is improving, platelets today's 45  Elevated troponin -Slightly elevated troponin 0.35, this is likely secondary to right heart strain from the submassive PE. No ACS.  Gross hematuria -Did have a Foley catheter till this morning, Foley catheter removed, not sure this is secondary to minor injury. -Patient also on Eliquis, monitor closely.  Anemia, acute, unspecified -Hemoglobin baseline from January of this year is 12.1, presented with hemoglobin of 10.36 admission. -Hemoglobin dropped to 8.9, monitor overnight, patient will be on Eliquis closely monitor as outpatient.  Lower extremity edema -Has mild fluid overload secondary to excessive IV fluids, IV fluids at 125 mL/hour, discontinued. -Give 1 dose of Lasix, check BMP in a.m.   DVT prophylaxis: On Eliquis Code Status: Full Code Family Communication:  Disposition Plan:  Diet: Diet regular Room service appropriate? Yes; Fluid consistency: Thin  Consultants:   Was under PCCM, [facility] picked up on 04/03/2016  Procedures:   None  Antimicrobials:   None  Objective: Vitals:   04/02/16 1400 04/02/16 1500 04/02/16 2028 04/03/16 0426  BP: 133/88 135/82 126/79 135/80  Pulse: 94 (!) 107 88 75  Resp: (!) 27 (!) Temp:  98.7 F (37.1 C) 98.2 F (36.8 C) 97.9 F (36.6 C)  TempSrc:  Oral Oral Oral  SpO2: 100% 100% 99% 100%  Weight:    78.4 kg (172 lb 14.4 oz)  Height:        Intake/Output Summary (Last 24 hours) at 04/03/16 1205 Last data filed at 04/03/16 0330  Gross per 24 hour  Intake  272 ml  Output             3150 ml  Net            -2878 ml   Filed Weights   04/01/16 0411 04/02/16 0500 04/03/16 0426  Weight: 79.9 kg (176 lb 1.6 oz) 75.1 kg (165 lb 9.1 oz) 78.4 kg (172 lb 14.4 oz)    Examination: General exam: Appears calm and comfortable  Respiratory system: Clear to auscultation. Respiratory effort  normal. Cardiovascular system: S1 & S2 heard, RRR. No JVD, murmurs, rubs, gallops or clicks. No pedal edema. Gastrointestinal system: Abdomen is nondistended, soft and nontender. No organomegaly or masses felt. Normal bowel sounds heard. Central nervous system: Alert and oriented. No focal neurological deficits. Extremities: Symmetric 5 x 5 power. Skin: No rashes, lesions or ulcers Psychiatry: Judgement and insight appear normal. Mood & affect appropriate.   Data Reviewed: I have personally reviewed following labs and imaging studies  CBC:  Recent Labs Lab 03/31/16 1828  04/01/16 0330 04/01/16 0958 04/01/16 2031 04/02/16 0436 04/03/16 0206  WBC 11.1*  < > 8.2 8.4 10.0 8.8 9.8  NEUTROABS 8.6*  --   --   --   --   --   --   HGB 10.3*  < > 9.1* 8.8* 8.6* 8.3* 8.9*  HCT 32.4*  < > 27.1* 26.9* 26.5* 25.2* 26.9*  MCV 95.3  < > 91.9 92.8 92.7 93.0 93.1  PLT 127*  < > 107* 91* 104* 114* 145*  < > = values in this interval not displayed. Basic Metabolic Panel:  Recent Labs Lab 03/31/16 1828 04/01/16 0345  NA 139 138  K 3.8 4.1  CL 106 108  CO2 20* 20*  GLUCOSE 193* 104*  BUN 9 8  CREATININE 1.29* 0.84  CALCIUM 8.1* 7.9*  MG  --  1.8  PHOS  --  3.4   GFR: Estimated Creatinine Clearance: 106.5 mL/min (by C-G formula based on SCr of 0.84 mg/dL). Liver Function Tests: No results for input(s): AST, ALT, ALKPHOS, BILITOT, PROT, ALBUMIN in the last 168 hours. No results for input(s): LIPASE, AMYLASE in the last 168 hours. No results for input(s): AMMONIA in the last 168 hours. Coagulation Profile:  Recent Labs Lab 03/31/16 1828  INR 1.34   Cardiac Enzymes: No results for input(s): CKTOTAL, CKMB, CKMBINDEX, TROPONINI in the last 168 hours. BNP (last 3 results) No results for input(s): PROBNP in the last 8760 hours. HbA1C:  Recent Labs  04/01/16 0100  HGBA1C 5.5   CBG:  Recent Labs Lab 04/01/16 2336 04/02/16 0355 04/02/16 0756 04/02/16 0847 04/02/16 1204   GLUCAP 84 88 66 110* 75   Lipid Profile: No results for input(s): CHOL, HDL, LDLCALC, TRIG, CHOLHDL, LDLDIRECT in the last 72 hours. Thyroid Function Tests: No results for input(s): TSH, T4TOTAL, FREET4, T3FREE, THYROIDAB in the last 72 hours. Anemia Panel: No results for input(s): VITAMINB12, FOLATE, FERRITIN, TIBC, IRON, RETICCTPCT in the last 72 hours. Urine analysis: No results found for: COLORURINE, APPEARANCEUR, LABSPEC, PHURINE, GLUCOSEU, HGBUR, BILIRUBINUR, KETONESUR, PROTEINUR, UROBILINOGEN, NITRITE, LEUKOCYTESUR Sepsis Labs: (procalcitonin:4,lacticidven:4)  ) Recent Results (from the past 240 hour(s))  MRSA PCR Screening     Status: None   Collection Time: 03/31/16 10:50 PM  Result Value Ref Range Status   MRSA by PCR NEGATIVE NEGATIVE Final    Comment:        The GeneXpert MRSA Assay (FDA approved for NASAL specimens only), is one component of a comprehensive MRSA colonization  surveillance program. It is not intended to diagnose MRSA infection nor to guide or monitor treatment for MRSA infections.      Invalid input(s): PROCALCITONIN, LACTICACIDVEN   Radiology Studies: Ir Rande Lawman F/u Eval Art/ven Subseq Day (ms)  Result Date: 04/01/2016 CLINICAL DATA:  Acute large volume bilateral pulmonary emboli, acute cor pulmonale, status post overnight catheter directed ultrasound assisted bilateral pulmonary artery thrombolytic confusion. Patient is clinically improved. EXAM: IR THROMB F/U EVAL ART/VEN SUBSEQ DAY (MS) ANESTHESIA/SEDATION: None MEDICATIONS: None CONTRAST:  None PROCEDURE: Bedside pulmonary arterial pressure measurements were obtained. The catheters and venous sheaths were removed and hemostasis achieved with manual compression. COMPLICATIONS: None immediate FINDINGS: Post treatment pulmonary arterial pressure 28/23 (26) mmHg (initially 41/22 (30) mmHg). IMPRESSION: 1. Decrease in systolic and mean pulmonary artery pressures post overnight bilateral  pulmonary arterial catheter directed ultrasound assisted thrombolytic infusion. Thrombolysis was discontinued and the catheters removed uneventfully. Electronically Signed   By: Corlis Leak M.D.   On: 04/01/2016 15:41        Scheduled Meds: . apixaban  10 mg Oral BID   Followed by  . [START ON 04/10/2016] apixaban  5 mg Oral BID   Continuous Infusions:   LOS: 3 days    Time spent: 35 minutes    Margarito Dehaas A, MD [facility] Pager [phone]  If 7PM-7AM, please contact night-coverage [email or web address] Password [email or web address] 04/03/2016, 12:05 PM

## 2016-04-03 NOTE — Discharge Summary (Deleted)
PROGRESS NOTE  Samantha Friedman  NWG:[id number] DOB: 1980/01/15 DOA: 03/31/2016 PCP: No PCP Per Patient Outpatient Specialists:  Subjective: Seen with family at bedside, denies any complaints, asked for the Foley catheter to be out.   Brief Narrative:  Samantha Friedman is a 36 y.o. F with PMH of anemia and scoliosis.  She presented to [facility] ED 3/29 with sharp pain between shoulder blades, SOB, diaphoresis, syncopal episode. She was supposedly exercising on a exercise bike when she lost consciousness. On EMS arrival, she was hypotensive and hypoxic.  She was brought to ED with CT of the chest revealed massive PE (RV/LV = 1.6). She was also in respiratory distress therefore was placed on BiPAP due to increased work of breathing.  PCCM was asked to admit to ICU and consider EKOS. Given increased work of breathing and troponin bump, IR was consulted and will proceed with EKOS.  Of note, she had ankle surgery in January of this year for ruptured Achilles tendon. She is also on oral contraceptives. She has not had any recent travel, hemoptysis, history of malignancy or prior VTE.  Assessment & Plan:   Principal Problem:   Pulmonary embolism (HCC) Active Problems:   Electrolyte imbalance   Anemia   Thrombocytopenia (HCC)   Acute respiratory failure with hypoxia (HCC)   Acute submassive pulmonary embolism -Patient presented to hospital with DOE, chest pain and syncope. -CT scan showed submassive PE, admitted initially to PCCM. -EKOS done by IR on 03/31/2016 -She was on heparin drip, switched to Eliquis on 04/03/2016.  Acute respiratory failure with hypoxia -Had severe respiratory distress, placed on BiPAP/Venturi mask on admission. -This is resolved, currently oxygen saturation in the high 90s on room air.  DVT -Level of the left lower extremity, likely provoked after surgery and immobility. -Patient is also on OCP. -Continue anticoagulation, use compression stockings to avoid post  phlebitic syndrome  Thrombocytopenia -Likely consumption thrombocytopenia, this is improving, platelets today's 45  Elevated troponin -Slightly elevated troponin 0.35, this is likely secondary to right heart strain from the submassive PE. No ACS.  Gross hematuria -Did have a Foley catheter till this morning, Foley catheter removed, not sure this is secondary to minor injury. -Patient also on Eliquis, monitor closely.  Anemia, acute, unspecified -Hemoglobin baseline from January of this year is 12.1, presented with hemoglobin of 10.36 admission. -Hemoglobin dropped to 8.9, monitor overnight, patient will be on Eliquis closely monitor as outpatient.  Lower extremity edema -Has mild fluid overload secondary to excessive IV fluids, IV fluids at 125 mL/hour, discontinued. -Give 1 dose of Lasix, check BMP in a.m.   DVT prophylaxis: On Eliquis Code Status: Full Code Family Communication:  Disposition Plan:  Diet: Diet regular Room service appropriate? Yes; Fluid consistency: Thin  Consultants:   Was under PCCM, [facility] picked up on 04/03/2016  Procedures:   None  Antimicrobials:   None  Objective: Vitals:   04/02/16 1400 04/02/16 1500 04/02/16 2028 04/03/16 0426  BP: 133/88 135/82 126/79 135/80  Pulse: 94 (!) 107 88 75  Resp: (!) 27 (!) Temp:  98.7 F (37.1 C) 98.2 F (36.8 C) 97.9 F (36.6 C)  TempSrc:  Oral Oral Oral  SpO2: 100% 100% 99% 100%  Weight:    78.4 kg (172 lb 14.4 oz)  Height:        Intake/Output Summary (Last 24 hours) at 04/03/16 1204 Last data filed at 04/03/16 0330  Gross per 24 hour  Intake  272 ml  Output             3150 ml  Net            -2878 ml   Filed Weights   04/01/16 0411 04/02/16 0500 04/03/16 0426  Weight: 79.9 kg (176 lb 1.6 oz) 75.1 kg (165 lb 9.1 oz) 78.4 kg (172 lb 14.4 oz)    Examination: General exam: Appears calm and comfortable  Respiratory system: Clear to auscultation. Respiratory effort  normal. Cardiovascular system: S1 & S2 heard, RRR. No JVD, murmurs, rubs, gallops or clicks. No pedal edema. Gastrointestinal system: Abdomen is nondistended, soft and nontender. No organomegaly or masses felt. Normal bowel sounds heard. Central nervous system: Alert and oriented. No focal neurological deficits. Extremities: Symmetric 5 x 5 power. Skin: No rashes, lesions or ulcers Psychiatry: Judgement and insight appear normal. Mood & affect appropriate.   Data Reviewed: I have personally reviewed following labs and imaging studies  CBC:  Recent Labs Lab 03/31/16 1828  04/01/16 0330 04/01/16 0958 04/01/16 2031 04/02/16 0436 04/03/16 0206  WBC 11.1*  < > 8.2 8.4 10.0 8.8 9.8  NEUTROABS 8.6*  --   --   --   --   --   --   HGB 10.3*  < > 9.1* 8.8* 8.6* 8.3* 8.9*  HCT 32.4*  < > 27.1* 26.9* 26.5* 25.2* 26.9*  MCV 95.3  < > 91.9 92.8 92.7 93.0 93.1  PLT 127*  < > 107* 91* 104* 114* 145*  < > = values in this interval not displayed. Basic Metabolic Panel:  Recent Labs Lab 03/31/16 1828 04/01/16 0345  NA 139 138  K 3.8 4.1  CL 106 108  CO2 20* 20*  GLUCOSE 193* 104*  BUN 9 8  CREATININE 1.29* 0.84  CALCIUM 8.1* 7.9*  MG  --  1.8  PHOS  --  3.4   GFR: Estimated Creatinine Clearance: 106.5 mL/min (by C-G formula based on SCr of 0.84 mg/dL). Liver Function Tests: No results for input(s): AST, ALT, ALKPHOS, BILITOT, PROT, ALBUMIN in the last 168 hours. No results for input(s): LIPASE, AMYLASE in the last 168 hours. No results for input(s): AMMONIA in the last 168 hours. Coagulation Profile:  Recent Labs Lab 03/31/16 1828  INR 1.34   Cardiac Enzymes: No results for input(s): CKTOTAL, CKMB, CKMBINDEX, TROPONINI in the last 168 hours. BNP (last 3 results) No results for input(s): PROBNP in the last 8760 hours. HbA1C:  Recent Labs  04/01/16 0100  HGBA1C 5.5   CBG:  Recent Labs Lab 04/01/16 2336 04/02/16 0355 04/02/16 0756 04/02/16 0847 04/02/16 1204   GLUCAP 84 88 66 110* 75   Lipid Profile: No results for input(s): CHOL, HDL, LDLCALC, TRIG, CHOLHDL, LDLDIRECT in the last 72 hours. Thyroid Function Tests: No results for input(s): TSH, T4TOTAL, FREET4, T3FREE, THYROIDAB in the last 72 hours. Anemia Panel: No results for input(s): VITAMINB12, FOLATE, FERRITIN, TIBC, IRON, RETICCTPCT in the last 72 hours. Urine analysis: No results found for: COLORURINE, APPEARANCEUR, LABSPEC, PHURINE, GLUCOSEU, HGBUR, BILIRUBINUR, KETONESUR, PROTEINUR, UROBILINOGEN, NITRITE, LEUKOCYTESUR Sepsis Labs: (procalcitonin:4,lacticidven:4)  ) Recent Results (from the past 240 hour(s))  MRSA PCR Screening     Status: None   Collection Time: 03/31/16 10:50 PM  Result Value Ref Range Status   MRSA by PCR NEGATIVE NEGATIVE Final    Comment:        The GeneXpert MRSA Assay (FDA approved for NASAL specimens only), is one component of a comprehensive MRSA colonization  surveillance program. It is not intended to diagnose MRSA infection nor to guide or monitor treatment for MRSA infections.      Invalid input(s): PROCALCITONIN, LACTICACIDVEN   Radiology Studies: Ir Rande Lawman F/u Eval Art/ven Subseq Day (ms)  Result Date: 04/01/2016 CLINICAL DATA:  Acute large volume bilateral pulmonary emboli, acute cor pulmonale, status post overnight catheter directed ultrasound assisted bilateral pulmonary artery thrombolytic confusion. Patient is clinically improved. EXAM: IR THROMB F/U EVAL ART/VEN SUBSEQ DAY (MS) ANESTHESIA/SEDATION: None MEDICATIONS: None CONTRAST:  None PROCEDURE: Bedside pulmonary arterial pressure measurements were obtained. The catheters and venous sheaths were removed and hemostasis achieved with manual compression. COMPLICATIONS: None immediate FINDINGS: Post treatment pulmonary arterial pressure 28/23 (26) mmHg (initially 41/22 (30) mmHg). IMPRESSION: 1. Decrease in systolic and mean pulmonary artery pressures post overnight bilateral  pulmonary arterial catheter directed ultrasound assisted thrombolytic infusion. Thrombolysis was discontinued and the catheters removed uneventfully. Electronically Signed   By: Corlis Leak M.D.   On: 04/01/2016 15:41        Scheduled Meds: . apixaban  10 mg Oral BID   Followed by  . [START ON 04/10/2016] apixaban  5 mg Oral BID   Continuous Infusions:   LOS: 3 days    Time spent: 35 minutes    Zuriel Yeaman A, MD [facility] Pager [phone]  If 7PM-7AM, please contact night-coverage [email or web address] Password [facility] 04/03/2016, 12:04 PM

## 2016-04-03 NOTE — Progress Notes (Signed)
ANTICOAGULATION CONSULT NOTE - Follow Up Consult  Pharmacy Consult for Heparin  Indication: pulmonary embolus  Allergies  Allergen Reactions  . Latex Rash    Patient Measurements: Height:  (180.3 cm) Weight: 165 lb 9.1 oz (75.1 kg) IBW/kg (Calculated) : 70.8  Vital Signs: Temp: 98.2 F (36.8 C) (03/31 2028) Temp Source: Oral (03/31 2028) BP: 126/79 (03/31 2028) Pulse Rate: 88 (03/31 2028)  Labs:  Recent Labs  03/31/16 1828  04/01/16 0345  04/01/16 1731 04/01/16 2031 04/02/16 0435 04/02/16 0436 04/03/16 0206  HGB 10.3*  < >  --   < >  --  8.6*  --  8.3* 8.9*  HCT 32.4*  < >  --   < >  --  26.5*  --  25.2* 26.9*  PLT 127*  < >  --   < >  --  104*  --  114* 145*  LABPROT 16.7*  --   --   --   --   --   --   --   --   INR 1.34  --   --   --   --   --   --   --   --   HEPARINUNFRC  --   < >  --   < > 0.54  --  0.46  --  0.25*  CREATININE 1.29*  --  0.84  --   --   --   --   --   --   < > = values in this interval not displayed.  Estimated Creatinine Clearance: 106.5 mL/min (by C-G formula based on SCr of 0.84 mg/dL).  Assessment: 36 y/o F with new onset bilateral PE, s/p ankle surgery ~1 month ago, s/p EKOS protocol, heparin level is low this AM, no issues per RN.   Goal of Therapy:  Heparin level 0.3-0.7 units/ml Monitor platelets by anticoagulation protocol: Yes   Plan:  -Inc heparin to 1200 units/hr -1100 HL  Abran Duke 04/03/2016,3:32 AM

## 2016-04-04 LAB — BASIC METABOLIC PANEL
Anion gap: 4 — ABNORMAL LOW (ref 5–15)
BUN: 5 mg/dL — ABNORMAL LOW (ref 6–20)
CALCIUM: 8.2 mg/dL — AB (ref 8.9–10.3)
CO2: 26 mmol/L (ref 22–32)
CREATININE: 0.78 mg/dL (ref 0.44–1.00)
Chloride: 106 mmol/L (ref 101–111)
Glucose, Bld: 89 mg/dL (ref 65–99)
Potassium: 3.6 mmol/L (ref 3.5–5.1)
SODIUM: 136 mmol/L (ref 135–145)

## 2016-04-04 LAB — CBC
HCT: 28.7 % — ABNORMAL LOW (ref 36.0–46.0)
Hemoglobin: 9.5 g/dL — ABNORMAL LOW (ref 12.0–15.0)
MCH: 30.5 pg (ref 26.0–34.0)
MCHC: 33.1 g/dL (ref 30.0–36.0)
MCV: 92.3 fL (ref 78.0–100.0)
PLATELETS: 155 10*3/uL (ref 150–400)
RBC: 3.11 MIL/uL — ABNORMAL LOW (ref 3.87–5.11)
RDW: 13.1 % (ref 11.5–15.5)
WBC: 9.5 10*3/uL (ref 4.0–10.5)

## 2016-04-04 LAB — HOMOCYSTEINE: Homocysteine: 6 umol/L (ref 0.0–15.0)

## 2016-04-04 MED ORDER — APIXABAN 5 MG PO TABS: ORAL_TABLET | ORAL | 0 refills | Status: DC

## 2016-04-04 MED FILL — ELIQUIS STARTER PACK 5 MG T: 5 | 30 days supply | Qty: 74 | Fill #0

## 2016-04-04 NOTE — Discharge Summary (Signed)
Physician Discharge Summary  Samantha Friedman GNF:[id number] DOB: [birth year] DOA: 03/31/2016  PCP: No PCP Per Patient  Admit date: 03/31/2016 Discharge date: 04/04/2016  Admitted From: Home Disposition: Home  Recommendations for Outpatient Follow-up:  1. Follow up with PCP in 1-2 weeks 2. Please obtain BMP/CBC in one week. 3. Recommend anticoagulation with Eliquis for total of 6 months.  Home Health: NA Equipment/Devices:NA  Discharge Condition: Stable CODE STATUS: Full Code Diet recommendation: Diet regular Room service appropriate? Yes; Fluid consistency: Thin  Brief/Interim Summary: Samantha Friedman a 36 y.o.F with PMH of anemia and scoliosis. She presented to [facility] ED 3/29 with sharp pain between shoulder blades, SOB, diaphoresis, syncopal episode. She was supposedly exercising on a exercise bike when she lost consciousness. On EMS arrival, she was hypotensive and hypoxic.  She was brought to ED with CT of the chest revealed massive PE (RV/LV = 1.6). She was also in respiratory distress therefore was placed on BiPAP due to increased work of breathing. PCCM was asked to admit to ICU and consider EKOS. Given increased work of breathing and troponin bump, IR was consulted and will proceed with EKOS.  Of note, she had ankle surgery in January of this year for ruptured Achilles tendon. She is also on oral contraceptives. She has not had any recent travel, hemoptysis, history of malignancy or prior VTE.  Discharge Diagnoses:  Principal Problem:   Pulmonary embolism (HCC) Active Problems:   Electrolyte imbalance   Anemia   Thrombocytopenia (HCC)   Acute respiratory failure with hypoxia (HCC)   Acute submassive pulmonary embolism -Patient presented to hospital with DOE, chest pain and syncope. -CT scan showed submassive PE, admitted initially to PCCM. -EKOS done by IR on 03/31/2016, pt tolerated well. -She was on heparin drip, switched to Eliquis on 04/03/2016, continue for at  least 6 months.  Acute respiratory failure with hypoxia -Had severe respiratory distress, placed on BiPAP/Venturi mask on admission. -This is resolved, currently oxygen saturation in the high 90s on room air.  DVT -Level of the left lower extremity, likely provoked after surgery and immobility. -Patient is also on OCP. -Continue anticoagulation, use compression stockings to avoid post-thrombotic syndrome. -Recommend to check for hypercoagulability after patient finishes intended anticoagulation course.  Thrombocytopenia -Likely consumption thrombocytopenia, down to 91, platelets back to normal on discharge day at 155. Resolved.  Elevated troponin -Slightly elevated troponin 0.35, this is likely secondary to right heart strain from the submassive PE. No evidence of ACS.  Gross hematuria -Did have a Foley catheter till this morning, Foley catheter removed, not sure this is secondary to minor injury. -Patient also on Eliquis, monitor closely. Improved prior to discharge.  Anemia, acute, unspecified -Hemoglobin baseline from January of this year is 12.1, presented with hemoglobin of 10.36 admission. -Hemoglobin dropped to 8.9, monitor overnight, patient will be on Eliquis closely monitor as outpatient.  Lower extremity edema -Has mild fluid overload secondary to excessive IV fluids, Lasix given, this is resolved prior to discharge.   Discharge Instructions  Discharge Instructions    Increase activity slowly    Complete by:  As directed      Allergies as of 04/04/2016      Reactions   Latex Rash      Medication List    STOP taking these medications   ibuprofen 800 MG tablet Commonly known as:  ADVIL,MOTRIN     TAKE these medications   apixaban 5 MG Tabs tablet Commonly known as:  ELIQUIS STARTER PACK Take 2 tablets PO  BID for 7 days, then 1 tablet PO BID   oxyCODONE-acetaminophen 5-325 MG tablet Commonly known as:  PERCOCET/ROXICET Take 1-2 tablets by mouth  every 4 (four) hours as needed for severe pain.   PREVIDENT 5000 PLUS 1.1 % Crea dental cream Generic drug:  sodium fluoride Place 1 application onto teeth at bedtime.   TRINESSA (28) 0.18/0.215/0.25 MG-35 MCG tablet Generic drug:  Norgestimate-Ethinyl Estradiol Triphasic TAKE 1 TABLET BY MOUTH EVERY DAY       Allergies  Allergen Reactions  . Latex Rash    Consultations:  PCCM  IR   Procedures (Echo, Carotid, EGD, Colonoscopy, ERCP)   Radiological studies: Ct Angio Chest Pe W And/or Wo Contrast  Result Date: 03/31/2016 CLINICAL DATA:  Hypoxia, hypotension and syncope. Chest pain. No cough or fever. On birth control. Recent surgery 01/14/2016. EXAM: CT ANGIOGRAPHY CHEST WITH CONTRAST TECHNIQUE: Multidetector CT imaging of the chest was performed using the standard protocol during bolus administration of intravenous contrast. Multiplanar CT image reconstructions and MIPs were obtained to evaluate the vascular anatomy. CONTRAST:  100 cc Isovue 370 IV COMPARISON:  None. FINDINGS: Cardiovascular: Acute bilateral multilobar pulmonary emboli with the RV/LV ratio 1.6. Near saddle embolus with most of the thrombus seen in the right main pulmonary artery. Cardiac chambers are normal in size. No pericardial effusion is identified. No aortic aneurysm or dissection. Mediastinum/Nodes: No enlarged mediastinal, hilar, or axillary lymph nodes. Thyroid gland, trachea, and esophagus demonstrate no significant findings. Lungs/Pleura: Parenchymal opacities in the periphery of the right lower lobe more likely represent pulmonary infarcts as opposed pneumonic consolidations given the clinical report of no fever and cough. Upper Abdomen: No acute abnormality. Musculoskeletal: Small cutaneous nodules along anterior lower chest wall. No acute or significant osseous findings. Review of the MIP images confirms the above findings. IMPRESSION: Positive for acute PE with CT evidence of right heart strain (RV/LV  Ratio = 1.6) consistent with at least submassive (intermediate risk) PE. The presence of right heart strain has been associated with an increased risk of morbidity and mortality. Please activate Code PE by paging [phone]. Critical Value/emergent results were called by telephone at the time of interpretation on 03/31/2016 at 6:37 pm to Dr. Lynden Oxford , who verbally acknowledged these results. Peripheral right lower lobe pulmonary opacities consistent with pulmonary infarct as opposed pneumonic consolidations given lack of clinical symptoms for pneumonia after discussion with Dr. Rush Landmark. Cutaneous nodules of the anterior chest wall overlying the lower sternum. Electronically Signed   By: Tollie Eth M.D.   On: 03/31/2016 18:39   Ir Angiogram Pulmonary Bilateral Selective  Result Date: 03/31/2016 INDICATION: 36 year old female with acute large volume bilateral pulmonary embolus and acute cor pulmonale. EXAM: IR INFUSION THROMBOL ARTERIAL INITIAL (MS); ADDITIONAL ARTERIOGRAPHY; IR ULTRASOUND GUIDANCE VASC ACCESS RIGHT; BILATERAL PULMONARY ARTERIOGRAPHY COMPARISON:  CTA chest 03/31/2016 MEDICATIONS: None. ANESTHESIA/SEDATION: Versed 1 mg IV; Fentanyl 25 mcg IV Moderate Sedation Time:  30 minutes The patient was continuously monitored during the procedure by the interventional radiology nurse under my direct supervision. FLUOROSCOPY TIME:  Fluoroscopy Time: 14 minutes 0 seconds (40 mGy). COMPLICATIONS: None immediate. TECHNIQUE: Informed written consent was obtained from the patient after a thorough discussion of the procedural risks, benefits and alternatives. All questions were addressed. Maximal Sterile Barrier Technique was utilized including caps, mask, sterile gowns, sterile gloves, sterile drape, hand hygiene and skin antiseptic. A timeout was performed prior to the initiation of the procedure. The right common femoral vein was interrogated with ultrasound and found to  be widely patent. An image  was obtained and stored for the medical record. Local anesthesia was attained by infiltration with 1% lidocaine. A small dermatotomy was made. Under real-time sonographic guidance, the vessel was punctured with a 21 gauge micropuncture needle. Using standard technique, the initial micro needle was exchanged over a 0.018 micro wire for a transitional 4 [place] micro sheath. The micro sheath was then exchanged over a 0.035 wire for a 6 French sheath. Using the same technique, a second right common femoral venous access was attained under ultrasound guidance and a second 6 [place] vascular sheath placed. A C2 cobra catheter was advanced over a Bentson wire through the more lateral sheath and advanced into the main pulmonary artery. A bilateral pulmonary arteriogram was performed. There is significant occlusive thrombus in the right main pulmonary artery extending into the right lower lobe pulmonary artery. Subocclusive thrombus is also present in the right upper lobar pulmonary artery. There is occlusive thrombus in the left lower lobe pulmonary artery. Pulmonary arterial pressures were then obtained. The main pulmonary arterial pressure is 41/22 with a mean of 30 mm Hg. The C2 cobra catheter was then exchanged over a Rosen wire for a 100 cm vertebral catheter. Using a glidewire, the catheter was successfully navigated beyond the occlusive thrombus in the right lower lobe pulmonary artery. The Glidewire was then exchanged for a Rosen wire and a 12 cm EKOS infusion catheter advanced over the wire. The catheter was flushed. The C2 cobra catheter was then advanced over a Bentson wire through the more medial sheath in used to select the left pulmonary artery. In the catheter was advanced beyond the thrombus in the left lower lobe pulmonary artery. A Rosen wire was placed. The cobra catheter was removed and a 12 cm EKOS infusion catheter was advanced over the wire. The 6 French sheaths were secured to the skin with 2-0  Prolene. Arterial thrombolysis was initiated with 1 milligram/hour tPA per catheter. FINDINGS: Pulmonary arterial hypertension. The main pulmonary arterial pressure is 41/22 with a mean of 30 mm Hg IMPRESSION: Successful initiation of bilateral catheter directed ultrasound assisted thrombolysis with EKOS. Signed, Sterling Big, MD [facility] Radiology Electronically Signed   By: Malachy Moan M.D.   On: 03/31/2016 20:59   Ir Angiogram Selective Each Additional Vessel  Result Date: 03/31/2016 INDICATION: 36 year old female with acute large volume bilateral pulmonary embolus and acute cor pulmonale. EXAM: IR INFUSION THROMBOL ARTERIAL INITIAL (MS); ADDITIONAL ARTERIOGRAPHY; IR ULTRASOUND GUIDANCE VASC ACCESS RIGHT; BILATERAL PULMONARY ARTERIOGRAPHY COMPARISON:  CTA chest 03/31/2016 MEDICATIONS: None. ANESTHESIA/SEDATION: Versed 1 mg IV; Fentanyl 25 mcg IV Moderate Sedation Time:  30 minutes The patient was continuously monitored during the procedure by the interventional radiology nurse under my direct supervision. FLUOROSCOPY TIME:  Fluoroscopy Time: 14 minutes 0 seconds (40 mGy). COMPLICATIONS: None immediate. TECHNIQUE: Informed written consent was obtained from the patient after a thorough discussion of the procedural risks, benefits and alternatives. All questions were addressed. Maximal Sterile Barrier Technique was utilized including caps, mask, sterile gowns, sterile gloves, sterile drape, hand hygiene and skin antiseptic. A timeout was performed prior to the initiation of the procedure. The right common femoral vein was interrogated with ultrasound and found to be widely patent. An image was obtained and stored for the medical record. Local anesthesia was attained by infiltration with 1% lidocaine. A small dermatotomy was made. Under real-time sonographic guidance, the vessel was punctured with a 21 gauge micropuncture needle. Using standard  technique, the initial micro needle was exchanged over a 0.018 micro wire for a transitional 4 [place] micro sheath. The micro sheath was then exchanged over a 0.035 wire for a 6 French sheath. Using the same technique, a second right common femoral venous access was attained under ultrasound guidance and a second 6 [place] vascular sheath placed. A C2 cobra catheter was advanced over a Bentson wire through the more lateral sheath and advanced into the main pulmonary artery. A bilateral pulmonary arteriogram was performed. There is significant occlusive thrombus in the right main pulmonary artery extending into the right lower lobe pulmonary artery. Subocclusive thrombus is also present in the right upper lobar pulmonary artery. There is occlusive thrombus in the left lower lobe pulmonary artery. Pulmonary arterial pressures were then obtained. The main pulmonary arterial pressure is 41/22 with a mean of 30 mm Hg. The C2 cobra catheter was then exchanged over a Rosen wire for a 100 cm vertebral catheter. Using a glidewire, the catheter was successfully navigated beyond the occlusive thrombus in the right lower lobe pulmonary artery. The Glidewire was then exchanged for a Rosen wire and a 12 cm EKOS infusion catheter advanced over the wire. The catheter was flushed. The C2 cobra catheter was then advanced over a Bentson wire through the more medial sheath in used to select the left pulmonary artery. In the catheter was advanced beyond the thrombus in the left lower lobe pulmonary artery. A Rosen wire was placed. The cobra catheter was removed and a 12 cm EKOS infusion catheter was advanced over the wire. The 6 French sheaths were secured to the skin with 2-0 Prolene. Arterial thrombolysis was initiated with 1 milligram/hour tPA per catheter. FINDINGS: Pulmonary arterial hypertension. The main pulmonary arterial pressure is 41/22 with a mean of 30 mm Hg IMPRESSION: Successful initiation of bilateral catheter directed  ultrasound assisted thrombolysis with EKOS. Signed, Sterling Big, MD [facility] Radiology Electronically Signed   By: Malachy Moan M.D.   On: 03/31/2016 20:59   Ir Angiogram Selective Each Additional Vessel  Result Date: 03/31/2016 INDICATION: 36 year old female with acute large volume bilateral pulmonary embolus and acute cor pulmonale. EXAM: IR INFUSION THROMBOL ARTERIAL INITIAL (MS); ADDITIONAL ARTERIOGRAPHY; IR ULTRASOUND GUIDANCE VASC ACCESS RIGHT; BILATERAL PULMONARY ARTERIOGRAPHY COMPARISON:  CTA chest 03/31/2016 MEDICATIONS: None. ANESTHESIA/SEDATION: Versed 1 mg IV; Fentanyl 25 mcg IV Moderate Sedation Time:  30 minutes The patient was continuously monitored during the procedure by the interventional radiology nurse under my direct supervision. FLUOROSCOPY TIME:  Fluoroscopy Time: 14 minutes 0 seconds (40 mGy). COMPLICATIONS: None immediate. TECHNIQUE: Informed written consent was obtained from the patient after a thorough discussion of the procedural risks, benefits and alternatives. All questions were addressed. Maximal Sterile Barrier Technique was utilized including caps, mask, sterile gowns, sterile gloves, sterile drape, hand hygiene and skin antiseptic. A timeout was performed prior to the initiation of the procedure. The right common femoral vein was interrogated with ultrasound and found to be widely patent. An image was obtained and stored for the medical record. Local anesthesia was attained by infiltration with 1% lidocaine. A small dermatotomy was made. Under real-time sonographic guidance, the vessel was punctured with a 21 gauge micropuncture needle. Using standard technique, the initial micro needle was exchanged over a 0.018 micro wire for a transitional 4 [place] micro sheath. The micro sheath was then exchanged over a 0.035 wire for a 6 French sheath. Using the same technique, a second right common femoral venous access  was  attained under ultrasound guidance and a second 6 [place] vascular sheath placed. A C2 cobra catheter was advanced over a Bentson wire through the more lateral sheath and advanced into the main pulmonary artery. A bilateral pulmonary arteriogram was performed. There is significant occlusive thrombus in the right main pulmonary artery extending into the right lower lobe pulmonary artery. Subocclusive thrombus is also present in the right upper lobar pulmonary artery. There is occlusive thrombus in the left lower lobe pulmonary artery. Pulmonary arterial pressures were then obtained. The main pulmonary arterial pressure is 41/22 with a mean of 30 mm Hg. The C2 cobra catheter was then exchanged over a Rosen wire for a 100 cm vertebral catheter. Using a glidewire, the catheter was successfully navigated beyond the occlusive thrombus in the right lower lobe pulmonary artery. The Glidewire was then exchanged for a Rosen wire and a 12 cm EKOS infusion catheter advanced over the wire. The catheter was flushed. The C2 cobra catheter was then advanced over a Bentson wire through the more medial sheath in used to select the left pulmonary artery. In the catheter was advanced beyond the thrombus in the left lower lobe pulmonary artery. A Rosen wire was placed. The cobra catheter was removed and a 12 cm EKOS infusion catheter was advanced over the wire. The 6 French sheaths were secured to the skin with 2-0 Prolene. Arterial thrombolysis was initiated with 1 milligram/hour tPA per catheter. FINDINGS: Pulmonary arterial hypertension. The main pulmonary arterial pressure is 41/22 with a mean of 30 mm Hg IMPRESSION: Successful initiation of bilateral catheter directed ultrasound assisted thrombolysis with EKOS. Signed, Sterling Big, MD [facility] Radiology Electronically Signed   By: Malachy Moan M.D.   On: 03/31/2016 20:59   Ir US Guide Vasc Access Right  Result  Date: 03/31/2016 INDICATION: 36 year old female with acute large volume bilateral pulmonary embolus and acute cor pulmonale. EXAM: IR INFUSION THROMBOL ARTERIAL INITIAL (MS); ADDITIONAL ARTERIOGRAPHY; IR ULTRASOUND GUIDANCE VASC ACCESS RIGHT; BILATERAL PULMONARY ARTERIOGRAPHY COMPARISON:  CTA chest 03/31/2016 MEDICATIONS: None. ANESTHESIA/SEDATION: Versed 1 mg IV; Fentanyl 25 mcg IV Moderate Sedation Time:  30 minutes The patient was continuously monitored during the procedure by the interventional radiology nurse under my direct supervision. FLUOROSCOPY TIME:  Fluoroscopy Time: 14 minutes 0 seconds (40 mGy). COMPLICATIONS: None immediate. TECHNIQUE: Informed written consent was obtained from the patient after a thorough discussion of the procedural risks, benefits and alternatives. All questions were addressed. Maximal Sterile Barrier Technique was utilized including caps, mask, sterile gowns, sterile gloves, sterile drape, hand hygiene and skin antiseptic. A timeout was performed prior to the initiation of the procedure. The right common femoral vein was interrogated with ultrasound and found to be widely patent. An image was obtained and stored for the medical record. Local anesthesia was attained by infiltration with 1% lidocaine. A small dermatotomy was made. Under real-time sonographic guidance, the vessel was punctured with a 21 gauge micropuncture needle. Using standard technique, the initial micro needle was exchanged over a 0.018 micro wire for a transitional 4 [place] micro sheath. The micro sheath was then exchanged over a 0.035 wire for a 6 French sheath. Using the same technique, a second right common femoral venous access was attained under ultrasound guidance and a second 6 [place] vascular sheath placed. A C2 cobra catheter was advanced over a Bentson wire through the more lateral sheath and advanced into the main pulmonary artery. A bilateral pulmonary arteriogram was performed. There is significant  occlusive thrombus in the right main pulmonary artery extending into the right lower lobe pulmonary artery. Subocclusive thrombus is also present in the right upper lobar pulmonary artery. There is occlusive thrombus in the left lower lobe pulmonary artery. Pulmonary arterial pressures were then obtained. The main pulmonary arterial pressure is 41/22 with a mean of 30 mm Hg. The C2 cobra catheter was then exchanged over a Rosen wire for a 100 cm vertebral catheter. Using a glidewire, the catheter was successfully navigated beyond the occlusive thrombus in the right lower lobe pulmonary artery. The Glidewire was then exchanged for a Rosen wire and a 12 cm EKOS infusion catheter advanced over the wire. The catheter was flushed. The C2 cobra catheter was then advanced over a Bentson wire through the more medial sheath in used to select the left pulmonary artery. In the catheter was advanced beyond the thrombus in the left lower lobe pulmonary artery. A Rosen wire was placed. The cobra catheter was removed and a 12 cm EKOS infusion catheter was advanced over the wire. The 6 French sheaths were secured to the skin with 2-0 Prolene. Arterial thrombolysis was initiated with 1 milligram/hour tPA per catheter. FINDINGS: Pulmonary arterial hypertension. The main pulmonary arterial pressure is 41/22 with a mean of 30 mm Hg IMPRESSION: Successful initiation of bilateral catheter directed ultrasound assisted thrombolysis with EKOS. Signed, Sterling Big, MD [facility] Radiology Electronically Signed   By: Malachy Moan M.D.   On: 03/31/2016 20:59   Dg Chest Portable 1 View  Result Date: 03/31/2016 CLINICAL DATA:  Shortness of breath EXAM: PORTABLE CHEST 1 VIEW COMPARISON:  Portable exam 1731 hours without priors for comparison FINDINGS: Normal heart size, mediastinal contours, and pulmonary vascularity. Infiltrate identified at the lateral RIGHT lung base consistent  with pneumonia. Remaining lungs clear. No pleural effusion or pneumothorax. Bones unremarkable. IMPRESSION: RIGHT basilar infiltrate consistent with pneumonia. Electronically Signed   By: Ulyses Southward M.D.   On: 03/31/2016 17:46   Ir Infusion Thrombol Arterial Initial (ms)  Result Date: 03/31/2016 INDICATION: 36 year old female with acute large volume bilateral pulmonary embolus and acute cor pulmonale. EXAM: IR INFUSION THROMBOL ARTERIAL INITIAL (MS); ADDITIONAL ARTERIOGRAPHY; IR ULTRASOUND GUIDANCE VASC ACCESS RIGHT; BILATERAL PULMONARY ARTERIOGRAPHY COMPARISON:  CTA chest 03/31/2016 MEDICATIONS: None. ANESTHESIA/SEDATION: Versed 1 mg IV; Fentanyl 25 mcg IV Moderate Sedation Time:  30 minutes The patient was continuously monitored during the procedure by the interventional radiology nurse under my direct supervision. FLUOROSCOPY TIME:  Fluoroscopy Time: 14 minutes 0 seconds (40 mGy). COMPLICATIONS: None immediate. TECHNIQUE: Informed written consent was obtained from the patient after a thorough discussion of the procedural risks, benefits and alternatives. All questions were addressed. Maximal Sterile Barrier Technique was utilized including caps, mask, sterile gowns, sterile gloves, sterile drape, hand hygiene and skin antiseptic. A timeout was performed prior to the initiation of the procedure. The right common femoral vein was interrogated with ultrasound and found to be widely patent. An image was obtained and stored for the medical record. Local anesthesia was attained by infiltration with 1% lidocaine. A small dermatotomy was made. Under real-time sonographic guidance, the vessel was punctured with a 21 gauge micropuncture needle. Using standard technique, the initial micro needle was exchanged over a 0.018 micro wire for a transitional 4 [place] micro sheath. The micro sheath was then exchanged over a 0.035 wire for a 6 French sheath. Using the same technique, a second right common femoral venous access  was attained under ultrasound guidance  and a second 6 [place] vascular sheath placed. A C2 cobra catheter was advanced over a Bentson wire through the more lateral sheath and advanced into the main pulmonary artery. A bilateral pulmonary arteriogram was performed. There is significant occlusive thrombus in the right main pulmonary artery extending into the right lower lobe pulmonary artery. Subocclusive thrombus is also present in the right upper lobar pulmonary artery. There is occlusive thrombus in the left lower lobe pulmonary artery. Pulmonary arterial pressures were then obtained. The main pulmonary arterial pressure is 41/22 with a mean of 30 mm Hg. The C2 cobra catheter was then exchanged over a Rosen wire for a 100 cm vertebral catheter. Using a glidewire, the catheter was successfully navigated beyond the occlusive thrombus in the right lower lobe pulmonary artery. The Glidewire was then exchanged for a Rosen wire and a 12 cm EKOS infusion catheter advanced over the wire. The catheter was flushed. The C2 cobra catheter was then advanced over a Bentson wire through the more medial sheath in used to select the left pulmonary artery. In the catheter was advanced beyond the thrombus in the left lower lobe pulmonary artery. A Rosen wire was placed. The cobra catheter was removed and a 12 cm EKOS infusion catheter was advanced over the wire. The 6 French sheaths were secured to the skin with 2-0 Prolene. Arterial thrombolysis was initiated with 1 milligram/hour tPA per catheter. FINDINGS: Pulmonary arterial hypertension. The main pulmonary arterial pressure is 41/22 with a mean of 30 mm Hg IMPRESSION: Successful initiation of bilateral catheter directed ultrasound assisted thrombolysis with EKOS. Signed, Sterling Big, MD [facility] Radiology Electronically Signed   By: Malachy Moan M.D.   On: 03/31/2016 20:59   Ir Infusion Thrombol Arterial Initial  (ms)  Result Date: 03/31/2016 INDICATION: 36 year old female with acute large volume bilateral pulmonary embolus and acute cor pulmonale. EXAM: IR INFUSION THROMBOL ARTERIAL INITIAL (MS); ADDITIONAL ARTERIOGRAPHY; IR ULTRASOUND GUIDANCE VASC ACCESS RIGHT; BILATERAL PULMONARY ARTERIOGRAPHY COMPARISON:  CTA chest 03/31/2016 MEDICATIONS: None. ANESTHESIA/SEDATION: Versed 1 mg IV; Fentanyl 25 mcg IV Moderate Sedation Time:  30 minutes The patient was continuously monitored during the procedure by the interventional radiology nurse under my direct supervision. FLUOROSCOPY TIME:  Fluoroscopy Time: 14 minutes 0 seconds (40 mGy). COMPLICATIONS: None immediate. TECHNIQUE: Informed written consent was obtained from the patient after a thorough discussion of the procedural risks, benefits and alternatives. All questions were addressed. Maximal Sterile Barrier Technique was utilized including caps, mask, sterile gowns, sterile gloves, sterile drape, hand hygiene and skin antiseptic. A timeout was performed prior to the initiation of the procedure. The right common femoral vein was interrogated with ultrasound and found to be widely patent. An image was obtained and stored for the medical record. Local anesthesia was attained by infiltration with 1% lidocaine. A small dermatotomy was made. Under real-time sonographic guidance, the vessel was punctured with a 21 gauge micropuncture needle. Using standard technique, the initial micro needle was exchanged over a 0.018 micro wire for a transitional 4 [place] micro sheath. The micro sheath was then exchanged over a 0.035 wire for a 6 French sheath. Using the same technique, a second right common femoral venous access was attained under ultrasound guidance and a second 6 [place] vascular sheath placed. A C2 cobra catheter was advanced over a Bentson wire through the more lateral sheath and advanced into the main pulmonary artery. A bilateral pulmonary arteriogram was performed. There  is significant occlusive thrombus in the right  main pulmonary artery extending into the right lower lobe pulmonary artery. Subocclusive thrombus is also present in the right upper lobar pulmonary artery. There is occlusive thrombus in the left lower lobe pulmonary artery. Pulmonary arterial pressures were then obtained. The main pulmonary arterial pressure is 41/22 with a mean of 30 mm Hg. The C2 cobra catheter was then exchanged over a Rosen wire for a 100 cm vertebral catheter. Using a glidewire, the catheter was successfully navigated beyond the occlusive thrombus in the right lower lobe pulmonary artery. The Glidewire was then exchanged for a Rosen wire and a 12 cm EKOS infusion catheter advanced over the wire. The catheter was flushed. The C2 cobra catheter was then advanced over a Bentson wire through the more medial sheath in used to select the left pulmonary artery. In the catheter was advanced beyond the thrombus in the left lower lobe pulmonary artery. A Rosen wire was placed. The cobra catheter was removed and a 12 cm EKOS infusion catheter was advanced over the wire. The 6 French sheaths were secured to the skin with 2-0 Prolene. Arterial thrombolysis was initiated with 1 milligram/hour tPA per catheter. FINDINGS: Pulmonary arterial hypertension. The main pulmonary arterial pressure is 41/22 with a mean of 30 mm Hg IMPRESSION: Successful initiation of bilateral catheter directed ultrasound assisted thrombolysis with EKOS. Signed, Sterling Big, MD [facility] Radiology Electronically Signed   By: Malachy Moan M.D.   On: 03/31/2016 20:59   Ir Rande Lawman F/u Eval Art/ven Basil Dess Day (ms)  Result Date: 04/01/2016 CLINICAL DATA:  Acute large volume bilateral pulmonary emboli, acute cor pulmonale, status post overnight catheter directed ultrasound assisted bilateral pulmonary artery thrombolytic confusion. Patient is clinically improved. EXAM: IR  THROMB F/U EVAL ART/VEN SUBSEQ DAY (MS) ANESTHESIA/SEDATION: None MEDICATIONS: None CONTRAST:  None PROCEDURE: Bedside pulmonary arterial pressure measurements were obtained. The catheters and venous sheaths were removed and hemostasis achieved with manual compression. COMPLICATIONS: None immediate FINDINGS: Post treatment pulmonary arterial pressure 28/23 (26) mmHg (initially 41/22 (30) mmHg). IMPRESSION: 1. Decrease in systolic and mean pulmonary artery pressures post overnight bilateral pulmonary arterial catheter directed ultrasound assisted thrombolytic infusion. Thrombolysis was discontinued and the catheters removed uneventfully. Electronically Signed   By: Corlis Leak M.D.   On: 04/01/2016 15:41     Subjective:  Discharge Exam: Vitals:   04/03/16 0426 04/03/16 1441 04/03/16 2035 04/04/16 0500  BP: 135/80 122/70 139/86 122/86  Pulse: 75 86 76 93  Resp: 18 18 20 18   Temp: 97.9 F (36.6 C)  98.2 F (36.8 C) 97.9 F (36.6 C)  TempSrc: Oral  Oral Oral  SpO2: 100% 100% 100% 100%  Weight: 78.4 kg (172 lb 14.4 oz)   74.1 kg (163 lb 4.8 oz)  Height:       General: Pt is alert, awake, not in acute distress Cardiovascular: RRR, S1/S2 +, no rubs, no gallops Respiratory: CTA bilaterally, no wheezing, no rhonchi Abdominal: Soft, NT, ND, bowel sounds + Extremities: no edema, no cyanosis   The results of significant diagnostics from this hospitalization (including imaging, microbiology, ancillary and laboratory) are listed below for reference.    Microbiology: Recent Results (from the past 240 hour(s))  MRSA PCR Screening     Status: None   Collection Time: 03/31/16 10:50 PM  Result Value Ref Range Status   MRSA by PCR NEGATIVE NEGATIVE Final    Comment:        The GeneXpert MRSA Assay (FDA approved for NASAL specimens only),  is one component of a comprehensive MRSA colonization surveillance program. It is not intended to diagnose MRSA infection nor to guide or monitor treatment  for MRSA infections.      Labs: BNP (last 3 results) No results for input(s): BNP in the last 8760 hours. Basic Metabolic Panel:  Recent Labs Lab 03/31/16 1828 04/01/16 0345 04/04/16 0237  NA 139 138 136  K 3.8 4.1 3.6  CL 106 108 106  CO2 20* 20* 26  GLUCOSE 193* 104* 89  BUN 9 8 5*  CREATININE 1.29* 0.84 0.78  CALCIUM 8.1* 7.9* 8.2*  MG  --  1.8  --   PHOS  --  3.4  --    Liver Function Tests: No results for input(s): AST, ALT, ALKPHOS, BILITOT, PROT, ALBUMIN in the last 168 hours. No results for input(s): LIPASE, AMYLASE in the last 168 hours. No results for input(s): AMMONIA in the last 168 hours. CBC:  Recent Labs Lab 03/31/16 1828  04/01/16 0958 04/01/16 2031 04/02/16 0436 04/03/16 0206 04/04/16 0237  WBC 11.1*  < > 8.4 10.0 8.8 9.8 9.5  NEUTROABS 8.6*  --   --   --   --   --   --   HGB 10.3*  < > 8.8* 8.6* 8.3* 8.9* 9.5*  HCT 32.4*  < > 26.9* 26.5* 25.2* 26.9* 28.7*  MCV 95.3  < > 92.8 92.7 93.0 93.1 92.3  PLT 127*  < > 91* 104* 114* 145* 155  < > = values in this interval not displayed. Cardiac Enzymes: No results for input(s): CKTOTAL, CKMB, CKMBINDEX, TROPONINI in the last 168 hours. BNP: Invalid input(s): POCBNP CBG:  Recent Labs Lab 04/01/16 2336 04/02/16 0355 04/02/16 0756 04/02/16 0847 04/02/16 1204  GLUCAP 84 88 66 110* 75   D-Dimer No results for input(s): DDIMER in the last 72 hours. Hgb A1c No results for input(s): HGBA1C in the last 72 hours. Lipid Profile No results for input(s): CHOL, HDL, LDLCALC, TRIG, CHOLHDL, LDLDIRECT in the last 72 hours. Thyroid function studies No results for input(s): TSH, T4TOTAL, T3FREE, THYROIDAB in the last 72 hours.  Invalid input(s): FREET3 Anemia work up No results for input(s): VITAMINB12, FOLATE, FERRITIN, TIBC, IRON, RETICCTPCT in the last 72 hours. Urinalysis No results found for: COLORURINE, APPEARANCEUR, LABSPEC, PHURINE, GLUCOSEU, HGBUR, BILIRUBINUR, KETONESUR, PROTEINUR,  UROBILINOGEN, NITRITE, LEUKOCYTESUR Sepsis Labs Invalid input(s): PROCALCITONIN,  WBC,  LACTICIDVEN Microbiology Recent Results (from the past 240 hour(s))  MRSA PCR Screening     Status: None   Collection Time: 03/31/16 10:50 PM  Result Value Ref Range Status   MRSA by PCR NEGATIVE NEGATIVE Final    Comment:        The GeneXpert MRSA Assay (FDA approved for NASAL specimens only), is one component of a comprehensive MRSA colonization surveillance program. It is not intended to diagnose MRSA infection nor to guide or monitor treatment for MRSA infections.      Time coordinating discharge: Over 30 minutes  SIGNED:   Clint Lipps, MD  [facility] 04/04/2016, 10:10 AM Pager   If 7PM-7AM, please contact night-coverage [email or web address] Password [email or web address]

## 2016-04-04 NOTE — Discharge Instructions (Signed)
Information on my medicine - ELIQUIS (apixaban)  This medication education was reviewed with me or my healthcare representative as part of my discharge preparation.   Why was Eliquis prescribed for you? Eliquis was prescribed to treat blood clots that may have been found in the veins of your legs (deep vein thrombosis) or in your lungs (pulmonary embolism) and to reduce the risk of them occurring again.  What do You need to know about Eliquis ? The starting dose is 10 mg (two 5 mg tablets) taken TWICE daily for the FIRST SEVEN (7) DAYS, then on 04/10/16  the dose is reduced to ONE 5 mg tablet taken TWICE daily.  Eliquis may be taken with or without food.   Try to take the dose about the same time in the morning and in the evening. If you have difficulty swallowing the tablet whole please discuss with your pharmacist how to take the medication safely.  Take Eliquis exactly as prescribed and DO NOT stop taking Eliquis without talking to the doctor who prescribed the medication.  Stopping may increase your risk of developing a new blood clot.  Refill your prescription before you run out.  After discharge, you should have regular check-up appointments with your healthcare provider that is prescribing your Eliquis.    What do you do if you miss a dose? If a dose of ELIQUIS is not taken at the scheduled time, take it as soon as possible on the same day and twice-daily administration should be resumed. The dose should not be doubled to make up for a missed dose.  Important Safety Information A possible side effect of Eliquis is bleeding. You should call your healthcare provider right away if you experience any of the following: ? Bleeding from an injury or your nose that does not stop. ? Unusual colored urine (red or dark brown) or unusual colored stools (red or black). ? Unusual bruising for unknown reasons. ? A serious fall or if you hit your head (even if there is no bleeding).  Some  medicines may interact with Eliquis and might increase your risk of bleeding or clotting while on Eliquis. To help avoid this, consult your healthcare provider or pharmacist prior to using any new prescription or non-prescription medications, including herbals, vitamins, non-steroidal anti-inflammatory drugs (NSAIDs) and supplements.  This website has more information on Eliquis (apixaban): http://www.eliquis.com/eliquis/home

## 2016-04-04 NOTE — Care Management Note (Signed)
Case Management Note Donn Pierini RN, BSN Unit 2W-Case Manager [phone]  Patient Details  Name: Samantha Friedman MRN: [id number] Date of Birth: 1980/12/01  Subjective/Objective:   Pt admitted with DVT/PE                 Action/Plan: PTA pt lived at home- independent, to d/c on Eliquis- have spoken with pt at bedside- pt has been given 30 day free card and the copay assist card- script has been sent to the [facility] on [facility]- call made to pharmacy to see if drug in stock- per pharmacy they do not have starter pack in stock and would have to order- calls also made to several other pharmacies- which also do not have starter pack in stock- [facility] called and spoke with Lupita Leash- who states they do have starter pack in stock- will have MD print script - which was given to pt along with address to [facility]- pt also given [facility] # to assist in finding a PCP.   Expected Discharge Date:  04/04/16               Expected Discharge Plan:  Home/Self Care  In-House Referral:     Discharge planning Services  CM Consult, Medication Assistance  Post Acute Care Choice:  NA Choice offered to:  NA  DME Arranged:    DME Agency:     HH Arranged:    HH Agency:     Status of Service:  Completed, signed off  If discussed at [employer] of Stay Meetings, dates discussed:    Additional Comments:  Darrold Span, RN 04/04/2016, 12:22 PM

## 2016-04-05 ENCOUNTER — Ambulatory Visit: Payer: 59 | Admitting: Physical Therapy

## 2016-04-05 ENCOUNTER — Telehealth: Payer: Self-pay | Admitting: Physical Therapy

## 2016-04-05 NOTE — Telephone Encounter (Signed)
PT contacted Dr Eliberto Ivory office and left message- stated pt name and DOB, that she was sent to ED on 3/29 and diagnosed with pulmonary embolism. She needs a new script to return to PT. Is in [place] this week with family and has apt on 4/19 with Dr Magnus Ivan.  Ski Polich C. Kaileena Obi PT, DPT 04/05/16 8:27 AM

## 2016-04-06 ENCOUNTER — Telehealth (INDEPENDENT_AMBULATORY_CARE_PROVIDER_SITE_OTHER): Payer: Self-pay

## 2016-04-06 NOTE — Telephone Encounter (Signed)
Ok to resume PT

## 2016-04-06 NOTE — Telephone Encounter (Signed)
Recently diagnosed with PE; she is out of town at this time. But PT needs script to continue therapy when she returns [phone]

## 2016-04-06 NOTE — Telephone Encounter (Signed)
Faxed note to PT

## 2016-04-07 LAB — FACTOR 5 LEIDEN

## 2016-04-12 ENCOUNTER — Encounter: Payer: 59 | Admitting: Physical Therapy

## 2016-04-13 ENCOUNTER — Telehealth: Payer: Self-pay | Admitting: Internal Medicine

## 2016-04-13 LAB — PROTHROMBIN GENE MUTATION

## 2016-04-13 NOTE — Telephone Encounter (Signed)
Rec'd disability forms - fwd to [employer] via interoffice mail -pr  °

## 2016-04-19 ENCOUNTER — Encounter: Payer: 59 | Admitting: Physical Therapy

## 2016-04-21 ENCOUNTER — Ambulatory Visit (INDEPENDENT_AMBULATORY_CARE_PROVIDER_SITE_OTHER): Payer: 59 | Admitting: Orthopaedic Surgery

## 2016-04-21 DIAGNOSIS — S86012D Strain of left Achilles tendon, subsequent encounter: Secondary | ICD-10-CM

## 2016-04-21 NOTE — Progress Notes (Signed)
The patient is a 36 year old who is now over 3 months status post a left Achilles tendon direct primary repair. Postoperatively we did have her in a splint for 2 weeks. She is someone who is on birth control and we did recommend daily 325 mg aspirin however is really uncertain as she take this or not. Unfortunately and March she did end up with a pulmonary embolus and a DVT. She is on the medication Eliquis now and states she will be on this for 6 months. She is walking rather she is now going to physical therapy.  On examination her left ankle still shows some swelling. Her [name] test is improving in terms of she has some very weak plantar flexion with squeezing her calf. The incision is healed.  We'll have her continue physical therapy and continue working on balance coordination and proprioception. She can put any type of scar cream she would like on her incision. We'll see her back in 3 months to see how she is doing overall.

## 2016-04-26 ENCOUNTER — Ambulatory Visit: Payer: 59 | Attending: Orthopaedic Surgery | Admitting: Physical Therapy

## 2016-04-26 ENCOUNTER — Encounter: Payer: Self-pay | Admitting: Physical Therapy

## 2016-04-26 DIAGNOSIS — M25572 Pain in left ankle and joints of left foot: Secondary | ICD-10-CM | POA: Insufficient documentation

## 2016-04-26 DIAGNOSIS — M25672 Stiffness of left ankle, not elsewhere classified: Secondary | ICD-10-CM | POA: Diagnosis present

## 2016-04-26 DIAGNOSIS — R600 Localized edema: Secondary | ICD-10-CM | POA: Diagnosis present

## 2016-04-26 DIAGNOSIS — R262 Difficulty in walking, not elsewhere classified: Secondary | ICD-10-CM

## 2016-04-26 NOTE — Therapy (Signed)
[facility] Outpatient Rehabilitation [facility] [address] [place], [name], [address] Phone: [phone]   Fax:  [phone]  Physical Therapy Treatment  Patient Details  Name: Vista Sawatzky MRN: [id number] Date of Birth: [birth year] Referring Provider: Doneen Poisson, MD  Encounter Date: 04/26/2016      PT End of Session - 04/26/16 1632    Visit Number 8   Number of Visits 17   Date for PT Re-Evaluation 06/03/16   Authorization Type U[employer]   PT Start Time 1633   PT Stop Time 1722   PT Time Calculation (min) 49 min   Activity Tolerance Patient tolerated treatment well   Behavior During Therapy [facility] for tasks assessed/performed      Past Medical History:  Diagnosis Date  . Anemia   . Scoliosis     Past Surgical History:  Procedure Laterality Date  . ACHILLES TENDON SURGERY Left 01/14/2016   Procedure: DIRECT PRIMARY REPAIR LEFT ACHILLES TENDON;  Surgeon: Kathryne Hitch, MD;  Location: [facility];  Service: Orthopedics;  Laterality: Left;  . broken thumb     . FRACTURE SURGERY    . IR GENERIC HISTORICAL  03/31/2016   IR INFUSION THROMBOL ARTERIAL INITIAL (MS) 03/31/2016 Malachy Moan, MD [facility]  . IR GENERIC HISTORICAL  03/31/2016   IR ANGIOGRAM SELECTIVE EACH ADDITIONAL VESSEL 03/31/2016 Malachy Moan, MD [facility]  . IR GENERIC HISTORICAL  03/31/2016   IR ANGIOGRAM SELECTIVE EACH ADDITIONAL VESSEL 03/31/2016 Malachy Moan, MD [facility]  . IR GENERIC HISTORICAL  03/31/2016   IR ANGIOGRAM PULMONARY BILATERAL SELECTIVE 03/31/2016 Malachy Moan, MD [facility]  . IR GENERIC HISTORICAL  03/31/2016   IR US GUIDE VASC ACCESS RIGHT 03/31/2016 Malachy Moan, MD [facility]  . IR GENERIC HISTORICAL  03/31/2016   IR INFUSION THROMBOL ARTERIAL INITIAL (MS) 03/31/2016 Malachy Moan, MD [facility]  . IR GENERIC HISTORICAL  04/01/2016   IR THROMB F/U EVAL ART/VEN SUBSEQ DAY (MS) 04/01/2016 Oley Balm, MD [facility]     There were no vitals filed for this visit.      Subjective Assessment - 04/26/16 1635    Subjective Denies pain in ankle, has been off crutches since 4/9. Wears compression sock every day. Is not wearing a lift in her shoe.    Currently in Pain? No/denies            [facility] PT Assessment - 04/26/16 0001      Assessment   Medical Diagnosis L achilles repair   Referring Provider Doneen Poisson, MD   Onset Date/Surgical Date 01/14/16   Next MD Visit 07/21/16     Balance Screen   Has the patient fallen in the past 6 months No     Home Environment   Living Environment Private residence   Additional Comments two story home     Prior Function   Level of Independence Independent     Cognition   Overall Cognitive Status Within Functional Limits for tasks assessed     Observation/Other Assessments   Focus on Therapeutic Outcomes (FOTO)  60% ability     Sensation   Additional Comments WFL     ROM / Strength   AROM / PROM / Strength Strength     AROM   Left Ankle Dorsiflexion 0     PROM   Left Ankle Dorsiflexion 0     Strength   Strength Assessment Site Ankle   Right/Left Ankle Left   Left Ankle Dorsiflexion 4+/5   Left Ankle  Plantar Flexion --  unable to perform standing single leg heel raise   Left Ankle Inversion 5/5   Left Ankle Eversion 5/5     Palpation   Palpation comment shoe is irritable to incision     Ambulation/Gait   Pre-Gait Activities L lower extremity IR, mild heel strike notable, lacking hip extension, excess knee flexion for swing through to compensate for lack of DF                     [facility] Adult PT Treatment/Exercise - 04/26/16 0001      Knee/Hip Exercises: Stretches   Gastroc Stretch 3 reps;30 seconds   Gastroc Stretch Limitations long sitting DF stretch with towel     Cryotherapy   Number Minutes Cryotherapy 10 Minutes   Cryotherapy Location Ankle   Type of Cryotherapy Ice pack     Ankle Exercises: Supine    T-Band green tband DF   Other Supine Ankle Exercises ABCs with leg elevated   Other Supine Ankle Exercises toe curls/extension leg elevated     Ankle Exercises: Standing   SLS chair available for UE support   Heel Raises 20 reps  bilateral, chair UE support for balance; neutral & ER     Ankle Exercises: Seated   Other Seated Ankle Exercises seated heel/toe raises                PT Education - 04/26/16 1719    Education provided Yes   Education Details exercise form/rationale, HEP, POC, gait pattern   Person(s) Educated Patient   Methods Explanation;Demonstration;Tactile cues;Verbal cues;Handout   Comprehension Verbalized understanding;Returned demonstration;Verbal cues required;Tactile cues required;Need further instruction          PT Short Term Goals - 04/26/16 1705      PT SHORT TERM GOAL #1   Title Pt will demo proper heel toe gait pattern for at least 50 ft without use of AD by 3/30   Baseline able   Status Achieved     PT SHORT TERM GOAL #2   Title DF ROM to at least 5 deg passively    Baseline 0   Status On-going           PT Long Term Goals - 04/26/16 1705      PT LONG TERM GOAL #1   Title FOTO to 66% ability to indicate significant improvement in functional ability by 06/03/2016   Baseline 60% ability on 4/24   Status On-going     PT LONG TERM GOAL #2   Title DF ROM to at least 10 deg for appropriate ROM for gait pattern   Baseline 0   Status On-going     PT LONG TERM GOAL #3   Title Pt will demo 5/5 MMT for all hip and ankle MMT to indicate appropraite support to joints by surrounding musculature   Baseline hip flexion 4/5   abduction  4/5  extension   5/5   Status On-going     PT LONG TERM GOAL #4   Title Pt will demo ability to jog lighly in order to return to PLOF and prior exercise program, ankle pain <=2/10   Baseline unable   Status On-going               Plan - 04/26/16 1715    Clinical Impression Statement Pt demo DF to  0 deg and was educated on importance of increasing range. Gait pattern suffering due to lack of DF ROM, see  flowsheet, but pt is not using AD or wearing heel lift. Mild swelling noted in L foot. Pt is making good progress toward goals and denies any pain. Will continue to benefit from skilled PT in order to reach long term functional goals and return to PLOF.    Rehab Potential Good   PT Frequency 2x / week   PT Duration 6 weeks   PT Treatment/Interventions ADLs/Self Care Home Management;Cryotherapy;Electrical Stimulation;Stair training;Functional mobility training;Gait training;Traction;Moist Heat;Therapeutic activities;Therapeutic exercise;Balance training;Neuromuscular re-education;Patient/family education;Passive range of motion;Scar mobilization;Manual techniques;Taping;Vasopneumatic Device   PT Next Visit Plan DF ROM, heel raises, SLS & proprioception   PT Home Exercise Plan long sitting DF stretch, ankle ABCs & toe scrunches elevated, SLS, resisted PF   Consulted and Agree with Plan of Care Patient      Patient will benefit from skilled therapeutic intervention in order to improve the following deficits and impairments:  Abnormal gait, Decreased range of motion, Difficulty walking, Increased fascial restricitons, Decreased activity tolerance, Pain, Impaired flexibility, Decreased scar mobility, Decreased balance, Decreased strength  Visit Diagnosis: Pain in left ankle and joints of left foot - Plan: PT plan of care cert/re-cert  Stiffness of left ankle, not elsewhere classified - Plan: PT plan of care cert/re-cert  Localized edema - Plan: PT plan of care cert/re-cert  Difficulty in walking, not elsewhere classified - Plan: PT plan of care cert/re-cert     Problem List Patient Active Problem List   Diagnosis Date Noted  . Electrolyte imbalance 04/01/2016  . Anemia 04/01/2016  . Thrombocytopenia (HCC) 04/01/2016  . Acute respiratory failure with hypoxia (HCC) 04/01/2016  .  Pulmonary embolism (HCC) 03/31/2016  . Rupture of left Achilles tendon 01/14/2016    Geeta Dworkin C. Amandeep Nesmith PT, DPT 04/26/16 5:27 PM   [facility] [address] [place], [name], [address] Phone: [phone]   Fax:  [phone]  Name: Chella Chapdelaine MRN: [id number] Date of Birth: 1980/08/18

## 2016-04-28 ENCOUNTER — Encounter: Payer: Self-pay | Admitting: Physical Therapy

## 2016-04-28 ENCOUNTER — Ambulatory Visit: Payer: 59 | Admitting: Physical Therapy

## 2016-04-28 DIAGNOSIS — R262 Difficulty in walking, not elsewhere classified: Secondary | ICD-10-CM

## 2016-04-28 DIAGNOSIS — R600 Localized edema: Secondary | ICD-10-CM

## 2016-04-28 DIAGNOSIS — M25672 Stiffness of left ankle, not elsewhere classified: Secondary | ICD-10-CM

## 2016-04-28 DIAGNOSIS — M25572 Pain in left ankle and joints of left foot: Secondary | ICD-10-CM | POA: Diagnosis not present

## 2016-04-28 NOTE — Therapy (Signed)
[facility] Outpatient Rehabilitation [facility] [address] [place], [name], [address] Phone: [phone]   Fax:  [phone]  Physical Therapy Treatment  Patient Details  Name: Samantha Friedman MRN: [id number] Date of Birth: 09-Mar-1980 Referring Provider: Doneen Poisson, MD  Encounter Date: 04/28/2016      PT End of Session - 04/28/16 1637    Visit Number 9   Number of Visits 17   Date for PT Re-Evaluation 06/03/16   Authorization Type UHC   PT Start Time 1634   PT Stop Time 1724   PT Time Calculation (min) 50 min   Activity Tolerance Patient tolerated treatment well   Behavior During Therapy [facility] for tasks assessed/performed      Past Medical History:  Diagnosis Date  . Anemia   . Scoliosis     Past Surgical History:  Procedure Laterality Date  . ACHILLES TENDON SURGERY Left 01/14/2016   Procedure: DIRECT PRIMARY REPAIR LEFT ACHILLES TENDON;  Surgeon: Kathryne Hitch, MD;  Location: [facility];  Service: Orthopedics;  Laterality: Left;  . broken thumb     . FRACTURE SURGERY    . IR GENERIC HISTORICAL  03/31/2016   IR INFUSION THROMBOL ARTERIAL INITIAL (MS) 03/31/2016 Malachy Moan, MD [facility]  . IR GENERIC HISTORICAL  03/31/2016   IR ANGIOGRAM SELECTIVE EACH ADDITIONAL VESSEL 03/31/2016 Malachy Moan, MD [facility]  . IR GENERIC HISTORICAL  03/31/2016   IR ANGIOGRAM SELECTIVE EACH ADDITIONAL VESSEL 03/31/2016 Malachy Moan, MD [facility]  . IR GENERIC HISTORICAL  03/31/2016   IR ANGIOGRAM PULMONARY BILATERAL SELECTIVE 03/31/2016 Malachy Moan, MD [facility]  . IR GENERIC HISTORICAL  03/31/2016   IR US GUIDE VASC ACCESS RIGHT 03/31/2016 Malachy Moan, MD [facility]  . IR GENERIC HISTORICAL  03/31/2016   IR INFUSION THROMBOL ARTERIAL INITIAL (MS) 03/31/2016 Malachy Moan, MD [facility]  . IR GENERIC HISTORICAL  04/01/2016   IR THROMB F/U EVAL ART/VEN SUBSEQ DAY (MS) 04/01/2016 Oley Balm, MD [facility]     There were no vitals filed for this visit.                       [facility] Adult PT Treatment/Exercise - 04/28/16 0001      Knee/Hip Exercises: Stretches   Gastroc Stretch 2 reps;30 seconds   Gastroc Stretch Limitations slant board     Knee/Hip Exercises: Aerobic   Nustep 5 min L5     Knee/Hip Exercises: Standing   Functional Squat 2 sets;10 reps   Functional Squat Limitations squat to tolerance, reach fwd with L hand to reduce rotation   SLS counter support, slight knee bend 2x1 min   Other Standing Knee Exercises retro walking, cues to roll foot     Knee/Hip Exercises: Sidelying   Clams both x30 green tband     Cryotherapy   Number Minutes Cryotherapy 10 Minutes  4 min concurrent with education   Cryotherapy Location Ankle   Type of Cryotherapy Ice pack     Ankle Exercises: Seated   Towel Inversion/Eversion Other (comment)  2 min   Heel Raises 20 reps  green tband around knees   BAPS Level 3  2x1 min each direction   Other Seated Ankle Exercises toe yoga                PT Education - 04/28/16 1636    Education provided Yes   Education Details exercise form/rationale, return to gym, movement and stretching at work, goal for  DF ROM increase   Person(s) Educated Patient   Methods Explanation;Demonstration;Tactile cues;Verbal cues   Comprehension Verbalized understanding;Returned demonstration;Verbal cues required;Tactile cues required;Need further instruction          PT Short Term Goals - 04/26/16 1705      PT SHORT TERM GOAL #1   Title Pt will demo proper heel toe gait pattern for at least 50 ft without use of AD by 3/30   Baseline able   Status Achieved     PT SHORT TERM GOAL #2   Title DF ROM to at least 5 deg passively    Baseline 0   Status On-going           PT Long Term Goals - 04/26/16 1705      PT LONG TERM GOAL #1   Title FOTO to 66% ability to indicate significant improvement in functional ability by 06/03/2016    Baseline 60% ability on 4/24   Status On-going     PT LONG TERM GOAL #2   Title DF ROM to at least 10 deg for appropriate ROM for gait pattern   Baseline 0   Status On-going     PT LONG TERM GOAL #3   Title Pt will demo 5/5 MMT for all hip and ankle MMT to indicate appropraite support to joints by surrounding musculature   Baseline hip flexion 4/5   abduction  4/5  extension   5/5   Status On-going     PT LONG TERM GOAL #4   Title Pt will demo ability to jog lighly in order to return to PLOF and prior exercise program, ankle pain <=2/10   Baseline unable   Status On-going               Plan - 04/28/16 1711    Clinical Impression Statement Weakness noted in L hip today due to decreased weight bearing. Pt cont to demo step to gait, challenged full length step pattern in retro gait today to encourage rolling over foot and increase ROM. Apparent LLD (L shorter) to be evaluated at a later date PRN.       Patient will benefit from skilled therapeutic intervention in order to improve the following deficits and impairments:     Visit Diagnosis: Pain in left ankle and joints of left foot  Stiffness of left ankle, not elsewhere classified  Localized edema  Difficulty in walking, not elsewhere classified     Problem List Patient Active Problem List   Diagnosis Date Noted  . Electrolyte imbalance 04/01/2016  . Anemia 04/01/2016  . Thrombocytopenia (HCC) 04/01/2016  . Acute respiratory failure with hypoxia (HCC) 04/01/2016  . Pulmonary embolism (HCC) 03/31/2016  . Rupture of left Achilles tendon 01/14/2016    [place] Madding C. Jasdeep Dejarnett PT, DPT 04/28/16 5:20 PM   [facility] Rehabilitation [facility] [address] [place], [name], [address] Phone: [phone]   Fax:  [phone]  Name: Samantha Friedman MRN: [id number] Date of Birth: 1980/10/05

## 2016-05-03 ENCOUNTER — Encounter: Payer: Self-pay | Admitting: Physical Therapy

## 2016-05-03 ENCOUNTER — Ambulatory Visit: Payer: 59 | Attending: Orthopaedic Surgery | Admitting: Physical Therapy

## 2016-05-03 DIAGNOSIS — M25672 Stiffness of left ankle, not elsewhere classified: Secondary | ICD-10-CM

## 2016-05-03 DIAGNOSIS — R600 Localized edema: Secondary | ICD-10-CM | POA: Insufficient documentation

## 2016-05-03 DIAGNOSIS — R262 Difficulty in walking, not elsewhere classified: Secondary | ICD-10-CM | POA: Insufficient documentation

## 2016-05-03 DIAGNOSIS — M25572 Pain in left ankle and joints of left foot: Secondary | ICD-10-CM | POA: Insufficient documentation

## 2016-05-03 NOTE — Therapy (Signed)
[facility] Outpatient Rehabilitation [facility] [address] [place], [name], [address] Phone: [phone]   Fax:  [phone]  Physical Therapy Treatment  Patient Details  Name: Samantha Friedman MRN: [id number] Date of Birth: [birth year] Referring Provider: Doneen Poisson, MD  Encounter Date: 05/03/2016      PT End of Session - 05/03/16 1630    Visit Number 10   Number of Visits 17   Date for PT Re-Evaluation 06/03/16   Authorization Type UHC   PT Start Time 1630   PT Stop Time 1720   PT Time Calculation (min) 50 min   Activity Tolerance Patient tolerated treatment well   Behavior During Therapy [facility] for tasks assessed/performed      Past Medical History:  Diagnosis Date  . Anemia   . Scoliosis     Past Surgical History:  Procedure Laterality Date  . ACHILLES TENDON SURGERY Left 01/14/2016   Procedure: DIRECT PRIMARY REPAIR LEFT ACHILLES TENDON;  Surgeon: Kathryne Hitch, MD;  Location: [facility];  Service: Orthopedics;  Laterality: Left;  . broken thumb     . FRACTURE SURGERY    . IR GENERIC HISTORICAL  03/31/2016   IR INFUSION THROMBOL ARTERIAL INITIAL (MS) 03/31/2016 Malachy Moan, MD [facility]  . IR GENERIC HISTORICAL  03/31/2016   IR ANGIOGRAM SELECTIVE EACH ADDITIONAL VESSEL 03/31/2016 Malachy Moan, MD [facility]  . IR GENERIC HISTORICAL  03/31/2016   IR ANGIOGRAM SELECTIVE EACH ADDITIONAL VESSEL 03/31/2016 Malachy Moan, MD [facility]  . IR GENERIC HISTORICAL  03/31/2016   IR ANGIOGRAM PULMONARY BILATERAL SELECTIVE 03/31/2016 Malachy Moan, MD [facility]  . IR GENERIC HISTORICAL  03/31/2016   IR US GUIDE VASC ACCESS RIGHT 03/31/2016 Malachy Moan, MD [facility]  . IR GENERIC HISTORICAL  03/31/2016   IR INFUSION THROMBOL ARTERIAL INITIAL (MS) 03/31/2016 Malachy Moan, MD [facility]  . IR GENERIC HISTORICAL  04/01/2016   IR THROMB F/U EVAL ART/VEN SUBSEQ DAY (MS) 04/01/2016 Oley Balm, MD [facility]     There were no vitals filed for this visit.      Subjective Assessment - 05/03/16 1632    Subjective Pt denies pain today. Walked up hill today when she was walking outside resulting in ankle swelling.    Currently in Pain? No/denies                         [facility] Adult PT Treatment/Exercise - 05/03/16 0001      Knee/Hip Exercises: Stretches   Passive Hamstring Stretch Limitations long sitting hamstring & gastroc stretch with strap     Knee/Hip Exercises: Aerobic   Nustep 5 min L5     Knee/Hip Exercises: Plyometrics   Bilateral Jumping Limitations bilateral LE mini squat hops     Knee/Hip Exercises: Standing   Wall Squat Limitations wall squat with fwd trunk lean   SLS single leg squat reach for foot with R hand   Other Standing Knee Exercises slow marching on airex 2 min     Knee/Hip Exercises: Supine   Bridges with Ball Squeeze 15 reps  in DF   Other Supine Knee/Hip Exercises bilat LE ball bw knees table top taps     Knee/Hip Exercises: Sidelying   Hip ABduction Both;20 reps;10 reps   Hip ABduction Limitations 90/90     Modalities   Modalities Vasopneumatic     Vasopneumatic   Number Minutes Vasopneumatic  15 minutes  5 min concurrent with education   Vasopnuematic Location  Ankle   Vasopneumatic Pressure Low   Vasopneumatic Temperature  34 deg                PT Education - 05/03/16 1633    Education provided Yes   Education Details exercise form/rationale, HEP, edema, shaping of achilled based on demand   Person(s) Educated Patient   Methods Explanation;Demonstration;Tactile cues;Verbal cues   Comprehension Verbalized understanding;Returned demonstration;Verbal cues required;Need further instruction;Tactile cues required          PT Short Term Goals - 04/26/16 1705      PT SHORT TERM GOAL #1   Title Pt will demo proper heel toe gait pattern for at least 50 ft without use of AD by 3/30   Baseline able   Status Achieved      PT SHORT TERM GOAL #2   Title DF ROM to at least 5 deg passively    Baseline 0   Status On-going           PT Long Term Goals - 04/26/16 1705      PT LONG TERM GOAL #1   Title FOTO to 66% ability to indicate significant improvement in functional ability by 06/03/2016   Baseline 60% ability on 4/24   Status On-going     PT LONG TERM GOAL #2   Title DF ROM to at least 10 deg for appropriate ROM for gait pattern   Baseline 0   Status On-going     PT LONG TERM GOAL #3   Title Pt will demo 5/5 MMT for all hip and ankle MMT to indicate appropraite support to joints by surrounding musculature   Baseline hip flexion 4/5   abduction  4/5  extension   5/5   Status On-going     PT LONG TERM GOAL #4   Title Pt will demo ability to jog lighly in order to return to PLOF and prior exercise program, ankle pain <=2/10   Baseline unable   Status On-going               Plan - 05/03/16 1709    Clinical Impression Statement challenged hip and thigh strength today to improve balance R to L as we begin challenging plyometric movements through ankle. Pt tends to squat with L hip travelling posteriorly and decreasing weight on R LE. Improving ROM notable with flexibility still lacking in L DF and hamstring v R.    PT Next Visit Plan DF ROM, heel raises, SLS & proprioception   PT Home Exercise Plan long sitting DF stretch, ankle ABCs & toe scrunches elevated, SLS, resisted PF, wall squat, single leg squat   Consulted and Agree with Plan of Care Patient      Patient will benefit from skilled therapeutic intervention in order to improve the following deficits and impairments:     Visit Diagnosis: Pain in left ankle and joints of left foot  Stiffness of left ankle, not elsewhere classified  Localized edema  Difficulty in walking, not elsewhere classified     Problem List Patient Active Problem List   Diagnosis Date Noted  . Electrolyte imbalance 04/01/2016  . Anemia 04/01/2016  .  Thrombocytopenia (HCC) 04/01/2016  . Acute respiratory failure with hypoxia (HCC) 04/01/2016  . Pulmonary embolism (HCC) 03/31/2016  . Rupture of left Achilles tendon 01/14/2016    Yuritza Paulhus C. Nedra Mcinnis PT, DPT 05/03/16 5:12 PM   [facility] Outpatient Rehabilitation [facility] [address] [place], [name], [address] Phone: [phone]   Fax:  [phone]  Name: Samantha Friedman MRN: [id number] Date of Birth: Mar 20, 1980

## 2016-05-05 ENCOUNTER — Encounter: Payer: Self-pay | Admitting: Physical Therapy

## 2016-05-05 ENCOUNTER — Ambulatory Visit: Payer: 59 | Admitting: Physical Therapy

## 2016-05-05 DIAGNOSIS — M25572 Pain in left ankle and joints of left foot: Secondary | ICD-10-CM | POA: Diagnosis not present

## 2016-05-05 DIAGNOSIS — R262 Difficulty in walking, not elsewhere classified: Secondary | ICD-10-CM

## 2016-05-05 DIAGNOSIS — M25672 Stiffness of left ankle, not elsewhere classified: Secondary | ICD-10-CM

## 2016-05-05 DIAGNOSIS — R600 Localized edema: Secondary | ICD-10-CM

## 2016-05-05 NOTE — Therapy (Signed)
[facility] Outpatient Rehabilitation [facility] [address] [place], [name], [address] Phone: [phone]   Fax:  [phone]  Physical Therapy Treatment  Patient Details  Name: Samantha Friedman MRN: [id number] Date of Birth: 23-Oct-1980 Referring Provider: Doneen Poisson, MD  Encounter Date: 05/05/2016      PT End of Session - 05/05/16 1634    Visit Number 11   Number of Visits 17   Date for PT Re-Evaluation 06/03/16   Authorization Type UHC   PT Start Time 1634   PT Stop Time 1722   PT Time Calculation (min) 48 min   Activity Tolerance Patient tolerated treatment well   Behavior During Therapy [facility] for tasks assessed/performed      Past Medical History:  Diagnosis Date  . Anemia   . Scoliosis     Past Surgical History:  Procedure Laterality Date  . ACHILLES TENDON SURGERY Left 01/14/2016   Procedure: DIRECT PRIMARY REPAIR LEFT ACHILLES TENDON;  Surgeon: Kathryne Hitch, MD;  Location: [facility];  Service: Orthopedics;  Laterality: Left;  . broken thumb     . FRACTURE SURGERY    . IR GENERIC HISTORICAL  03/31/2016   IR INFUSION THROMBOL ARTERIAL INITIAL (MS) 03/31/2016 Malachy Moan, MD [facility]  . IR GENERIC HISTORICAL  03/31/2016   IR ANGIOGRAM SELECTIVE EACH ADDITIONAL VESSEL 03/31/2016 Malachy Moan, MD [facility]  . IR GENERIC HISTORICAL  03/31/2016   IR ANGIOGRAM SELECTIVE EACH ADDITIONAL VESSEL 03/31/2016 Malachy Moan, MD [facility]  . IR GENERIC HISTORICAL  03/31/2016   IR ANGIOGRAM PULMONARY BILATERAL SELECTIVE 03/31/2016 Malachy Moan, MD [facility]  . IR GENERIC HISTORICAL  03/31/2016   IR US GUIDE VASC ACCESS RIGHT 03/31/2016 Malachy Moan, MD [facility]  . IR GENERIC HISTORICAL  03/31/2016   IR INFUSION THROMBOL ARTERIAL INITIAL (MS) 03/31/2016 Malachy Moan, MD [facility]  . IR GENERIC HISTORICAL  04/01/2016   IR THROMB F/U EVAL ART/VEN SUBSEQ DAY (MS) 04/01/2016 Oley Balm, MD [facility]     There were no vitals filed for this visit.      Subjective Assessment - 05/05/16 1637    Subjective Did not walk outside today due to heat. Reports ankle feels pretty good.    Currently in Pain? No/denies                         [facility] Adult PT Treatment/Exercise - 05/05/16 0001      Knee/Hip Exercises: Stretches   Passive Hamstring Stretch Limitations long sitting hamstring & gastroc stretch with strap     Knee/Hip Exercises: Aerobic   Elliptical 5 min L1 rampt 10     Knee/Hip Exercises: Plyometrics   Bilateral Jumping Limitations bilateral hopping on trampoline   Unilateral Jumping Limitations jogging on trampoline     Knee/Hip Exercises: Standing   Heel Raises Limitations edge of step 3x5   Rebounder SLS 2x1 min   Other Standing Knee Exercises tandem gait fwd & retro     Knee/Hip Exercises: Seated   Sit to Sand 15 reps  with heel raise at stand     Knee/Hip Exercises: Supine   Bridges Limitations alt single leg bridge, blue tband at knees     Vasopneumatic   Number Minutes Vasopneumatic  15 minutes   Vasopnuematic Location  Ankle   Vasopneumatic Pressure Low   Vasopneumatic Temperature  34 deg                  PT  Short Term Goals - 04/26/16 1705      PT SHORT TERM GOAL #1   Title Pt will demo proper heel toe gait pattern for at least 50 ft without use of AD by 3/30   Baseline able   Status Achieved     PT SHORT TERM GOAL #2   Title DF ROM to at least 5 deg passively    Baseline 0   Status On-going           PT Long Term Goals - 04/26/16 1705      PT LONG TERM GOAL #1   Title FOTO to 66% ability to indicate significant improvement in functional ability by 06/03/2016   Baseline 60% ability on 4/24   Status On-going     PT LONG TERM GOAL #2   Title DF ROM to at least 10 deg for appropriate ROM for gait pattern   Baseline 0   Status On-going     PT LONG TERM GOAL #3   Title Pt will demo 5/5 MMT for all hip and ankle  MMT to indicate appropraite support to joints by surrounding musculature   Baseline hip flexion 4/5   abduction  4/5  extension   5/5   Status On-going     PT LONG TERM GOAL #4   Title Pt will demo ability to jog lighly in order to return to PLOF and prior exercise program, ankle pain <=2/10   Baseline unable   Status On-going               Plan - 05/05/16 1710    Clinical Impression Statement No pain with exercises today but pt with notable balance difficulty L vs R. Asked pt to ambulate on more unsteady surfaces at home such as grass for endurance challenges to ankle. Gait pattern improved significantly, still lacking full toe off phase.    PT Next Visit Plan DF ROM, plyometric, squat equal weight acceptance   PT Home Exercise Plan long sitting DF stretch, ankle ABCs & toe scrunches elevated, SLS, resisted PF, wall squat, single leg squat   Consulted and Agree with Plan of Care Patient      Patient will benefit from skilled therapeutic intervention in order to improve the following deficits and impairments:     Visit Diagnosis: Pain in left ankle and joints of left foot  Stiffness of left ankle, not elsewhere classified  Localized edema  Difficulty in walking, not elsewhere classified     Problem List Patient Active Problem List   Diagnosis Date Noted  . Electrolyte imbalance 04/01/2016  . Anemia 04/01/2016  . Thrombocytopenia (HCC) 04/01/2016  . Acute respiratory failure with hypoxia (HCC) 04/01/2016  . Pulmonary embolism (HCC) 03/31/2016  . Rupture of left Achilles tendon 01/14/2016    Sarath Privott C. Richetta Cubillos PT, DPT 05/05/16 5:14 PM   [facility] [address] [place], [place], [address] Phone: [phone]   Fax:  [phone]  Name: Rosendo GrosShanika Virts MRN: [id number] Date of Birth: 02/20/1980

## 2016-05-10 ENCOUNTER — Ambulatory Visit: Payer: 59 | Admitting: Physical Therapy

## 2016-05-10 ENCOUNTER — Encounter: Payer: Self-pay | Admitting: Physical Therapy

## 2016-05-10 DIAGNOSIS — M25672 Stiffness of left ankle, not elsewhere classified: Secondary | ICD-10-CM

## 2016-05-10 DIAGNOSIS — M25572 Pain in left ankle and joints of left foot: Secondary | ICD-10-CM | POA: Diagnosis not present

## 2016-05-10 DIAGNOSIS — R262 Difficulty in walking, not elsewhere classified: Secondary | ICD-10-CM

## 2016-05-10 DIAGNOSIS — R600 Localized edema: Secondary | ICD-10-CM

## 2016-05-10 NOTE — Therapy (Signed)
[facility] [address] [place], [place], [address] Phone: [phone]   Fax:  [phone]  Physical Therapy Treatment  Patient Details  Name: Samantha Friedman MRN: [id number] Date of Birth: 09/14/1980 Referring Provider: Doneen Poissonhristopher Blackman, MD  Encounter Date: 05/10/2016      PT End of Session - 05/10/16 1628    Visit Number 12   Number of Visits 17   Date for PT Re-Evaluation 06/03/16   Authorization Type UHC   PT Start Time 1628   PT Stop Time 1722   PT Time Calculation (min) 54 min   Activity Tolerance Patient tolerated treatment well   Behavior During Therapy [facility] for tasks assessed/performed      Past Medical History:  Diagnosis Date  . Anemia   . Scoliosis     Past Surgical History:  Procedure Laterality Date  . ACHILLES TENDON SURGERY Left 01/14/2016   Procedure: DIRECT PRIMARY REPAIR LEFT ACHILLES TENDON;  Surgeon: Kathryne Hitchhristopher Y Blackman, MD;  Location: [facility];  Service: Orthopedics;  Laterality: Left;  . broken thumb     . FRACTURE SURGERY    . IR GENERIC HISTORICAL  03/31/2016   IR INFUSION THROMBOL ARTERIAL INITIAL (MS) 03/31/2016 Malachy MoanHeath McCullough, MD [facility]  . IR GENERIC HISTORICAL  03/31/2016   IR ANGIOGRAM SELECTIVE EACH ADDITIONAL VESSEL 03/31/2016 Malachy MoanHeath McCullough, MD [facility]  . IR GENERIC HISTORICAL  03/31/2016   IR ANGIOGRAM SELECTIVE EACH ADDITIONAL VESSEL 03/31/2016 Malachy MoanHeath McCullough, MD [facility]  . IR GENERIC HISTORICAL  03/31/2016   IR ANGIOGRAM PULMONARY BILATERAL SELECTIVE 03/31/2016 Malachy MoanHeath McCullough, MD [facility]  . IR GENERIC HISTORICAL  03/31/2016   IR US GUIDE VASC ACCESS RIGHT 03/31/2016 Malachy MoanHeath McCullough, MD [facility]  . IR GENERIC HISTORICAL  03/31/2016   IR INFUSION THROMBOL ARTERIAL INITIAL (MS) 03/31/2016 Malachy MoanHeath McCullough, MD [facility]  . IR GENERIC HISTORICAL  04/01/2016   IR THROMB F/U EVAL ART/VEN SUBSEQ DAY (MS) 04/01/2016 Oley Balmaniel Hassell, MD [facility]     There were no vitals filed for this visit.      Subjective Assessment - 05/10/16 1631    Subjective Denies any pain or discomfort in ankle. Walked hill again outside and did better. Has been wearing compression sock and does not feel that she is having as much swelling.    Currently in Pain? No/denies            [facility] PT Assessment - 05/10/16 0001      Observation/Other Assessments   Focus on Therapeutic Outcomes (FOTO)  61% ability     AROM   Left Ankle Dorsiflexion 4     PROM   Left Ankle Dorsiflexion 10     Strength   Strength Assessment Site Hip   Right/Left Hip Left   Left Hip Flexion 5/5   Left Hip Extension 5/5   Left Hip ABduction 5/5   Left Ankle Dorsiflexion 5/5   Left Ankle Plantar Flexion --  unable to perform single leg heel raise                     [facility] Adult PT Treatment/Exercise - 05/10/16 0001      Knee/Hip Exercises: Stretches   Gastroc Stretch 2 reps;30 seconds   Gastroc Stretch Limitations slant board     Knee/Hip Exercises: Aerobic   Elliptical 5 min L1 ramp 10     Knee/Hip Exercises: Standing   SLS single leg squat, bar reaching forward   Rebounder SLS 2x1  min   Other Standing Knee Exercises squat to table, bar overhead full stand x10, 1/2 stand x10     Cryotherapy   Number Minutes Cryotherapy 10 Minutes   Cryotherapy Location Ankle   Type of Cryotherapy Ice pack     Ankle Exercises: Seated   Other Seated Ankle Exercises resisted plantarflexion- soleus & gastroc/soleus 30 each     Ankle Exercises: Standing   Heel Raises 20 reps;10 reps  bilateral raise, L leg lower   Other Standing Ankle Exercises tilt board L SLS A/P                PT Education - 05/10/16 1634    Education provided Yes   Education Details exercise form/rationale, discussion of progress with goals, strength & ROM   Person(s) Educated Patient   Methods Explanation;Demonstration;Tactile cues;Verbal cues   Comprehension Verbalized  understanding;Returned demonstration;Verbal cues required;Tactile cues required;Need further instruction          PT Short Term Goals - 04/26/16 1705      PT SHORT TERM GOAL #1   Title Pt will demo proper heel toe gait pattern for at least 50 ft without use of AD by 3/30   Baseline able   Status Achieved     PT SHORT TERM GOAL #2   Title DF ROM to at least 5 deg passively    Baseline 0   Status On-going           PT Long Term Goals - 05/10/16 1646      PT LONG TERM GOAL #1   Title FOTO to 66% ability to indicate significant improvement in functional ability by 06/03/2016   Baseline 61% ability   Status On-going     PT LONG TERM GOAL #2   Title DF ROM to at least 10 deg for appropriate ROM for gait pattern   Baseline 10 deg passive   Status Achieved     PT LONG TERM GOAL #3   Title Pt will demo 5/5 MMT for all hip and ankle MMT to indicate appropraite support to joints by surrounding musculature   Baseline plantar flexion 3-/5   Status On-going     PT LONG TERM GOAL #4   Title Pt will demo ability to jog lighly in order to return to PLOF and prior exercise program, ankle pain <=2/10   Baseline unable due to lack of strength from gastroc/soleus   Status On-going               Plan - 05/10/16 1714    Clinical Impression Statement Good progress to goals in strength of hip and ankle except for plantar flexion. Did not try jogging today due to lack of strength produced by gastroc/soleus. Will continue to challenge functional strength.    PT Next Visit Plan squat balance with flat feet, plantarflexion strength   PT Home Exercise Plan long sitting DF stretch, ankle ABCs & toe scrunches elevated, SLS, resisted PF, wall squat, single leg squat; bilateral heel raise & L leg lower, resisted PF black tband   Consulted and Agree with Plan of Care Patient      Patient will benefit from skilled therapeutic intervention in order to improve the following deficits and  impairments:     Visit Diagnosis: Pain in left ankle and joints of left foot  Stiffness of left ankle, not elsewhere classified  Localized edema  Difficulty in walking, not elsewhere classified     Problem List Patient Active Problem List  Diagnosis Date Noted  . Electrolyte imbalance 04/01/2016  . Anemia 04/01/2016  . Thrombocytopenia (HCC) 04/01/2016  . Acute respiratory failure with hypoxia (HCC) 04/01/2016  . Pulmonary embolism (HCC) 03/31/2016  . Rupture of left Achilles tendon 01/14/2016    Ravonda Brecheen C. Jacquelinne Speak PT, DPT 05/10/16 5:24 PM   [facility] [address] [place], [name], [address] Phone: [phone]   Fax:  [phone]  Name: Samantha Friedman MRN: [id number] Date of Birth: 1980/02/05

## 2016-05-12 ENCOUNTER — Encounter: Payer: Self-pay | Admitting: Physical Therapy

## 2016-05-12 ENCOUNTER — Ambulatory Visit: Payer: 59 | Admitting: Physical Therapy

## 2016-05-12 DIAGNOSIS — R262 Difficulty in walking, not elsewhere classified: Secondary | ICD-10-CM

## 2016-05-12 DIAGNOSIS — M25672 Stiffness of left ankle, not elsewhere classified: Secondary | ICD-10-CM

## 2016-05-12 DIAGNOSIS — R600 Localized edema: Secondary | ICD-10-CM

## 2016-05-12 DIAGNOSIS — M25572 Pain in left ankle and joints of left foot: Secondary | ICD-10-CM | POA: Diagnosis not present

## 2016-05-12 NOTE — Therapy (Signed)
[facility] [address] [place], [name], [address] Phone: [phone]   Fax:  [phone]  Physical Therapy Treatment  Patient Details  Name: Samantha Friedman MRN: [id number] Date of Birth: 1980-09-15 Referring Provider: Doneen Poisson, MD  Encounter Date: 05/12/2016      PT End of Session - 05/12/16 1738    Visit Number 13   Number of Visits 17   Date for PT Re-Evaluation 06/03/16   PT Start Time 1629   PT Stop Time 1720   PT Time Calculation (min) 51 min   Activity Tolerance Patient tolerated treatment well   Behavior During Therapy [facility] for tasks assessed/performed      Past Medical History:  Diagnosis Date  . Anemia   . Scoliosis     Past Surgical History:  Procedure Laterality Date  . ACHILLES TENDON SURGERY Left 01/14/2016   Procedure: DIRECT PRIMARY REPAIR LEFT ACHILLES TENDON;  Surgeon: Kathryne Hitch, MD;  Location: [facility];  Service: Orthopedics;  Laterality: Left;  . broken thumb     . FRACTURE SURGERY    . IR GENERIC HISTORICAL  03/31/2016   IR INFUSION THROMBOL ARTERIAL INITIAL (MS) 03/31/2016 Malachy Moan, MD [facility]  . IR GENERIC HISTORICAL  03/31/2016   IR ANGIOGRAM SELECTIVE EACH ADDITIONAL VESSEL 03/31/2016 Malachy Moan, MD [facility]  . IR GENERIC HISTORICAL  03/31/2016   IR ANGIOGRAM SELECTIVE EACH ADDITIONAL VESSEL 03/31/2016 Malachy Moan, MD [facility]  . IR GENERIC HISTORICAL  03/31/2016   IR ANGIOGRAM PULMONARY BILATERAL SELECTIVE 03/31/2016 Malachy Moan, MD [facility]  . IR GENERIC HISTORICAL  03/31/2016   IR US GUIDE VASC ACCESS RIGHT 03/31/2016 Malachy Moan, MD [facility]  . IR GENERIC HISTORICAL  03/31/2016   IR INFUSION THROMBOL ARTERIAL INITIAL (MS) 03/31/2016 Malachy Moan, MD [facility]  . IR GENERIC HISTORICAL  04/01/2016   IR THROMB F/U EVAL ART/VEN SUBSEQ DAY (MS) 04/01/2016 Oley Balm, MD [facility]    There were no vitals filed  for this visit.      Subjective Assessment - 05/12/16 1641    Subjective No pain now.  Had heel pain with getting out of bed.  Felt like a stone.    Currently in Pain? No/denies                         [facility] Adult PT Treatment/Exercise - 05/12/16 0001      Knee/Hip Exercises: Stretches   Gastroc Stretch 2 reps;30 seconds   Gastroc Stretch Limitations slant board   Other Knee/Hip Stretches showed how to stretch bottom of foot if heel gets sore again.      Knee/Hip Exercises: Aerobic   Elliptical 5 min L1 ramp 10     Knee/Hip Exercises: Standing   Heel Raises Limitations 10 2 lift , 1 lower, difficult   SLS single leg squat, bar reaching forward  4  X until hip fatrigue noted with decreased control   SLS with Vectors Plyotoss 13 in row best,  3 bouts. green ball   Rebounder SLS 2x1 min   Walking with Sports Cord 17 LBS on cable cross pulling forward and reverse , SBA, 10 feet 10 X  increased use of toes and gastroc noted.   Other Standing Knee Exercises squat to table, bar overhead full stand x10, 1/2 stand x10     Knee/Hip Exercises: Sidelying   Hip ABduction 10 reps;2 sets   Hip ABduction Limitations HEP  Clams 2 sets, 10 reps HEP     Cryotherapy   Number Minutes Cryotherapy 10 Minutes   Cryotherapy Location Ankle   Type of Cryotherapy --  cold pack,  extra layers                PT Education - 05/12/16 1738    Education provided Yes   Education Details HEP   Person(s) Educated Patient   Methods Explanation;Demonstration;Tactile cues;Verbal cues;Handout   Comprehension Verbalized understanding;Returned demonstration          PT Short Term Goals - 05/12/16 1753      PT SHORT TERM GOAL #1   Title Pt will demo proper heel toe gait pattern for at least 50 ft without use of AD by 3/30   Period Weeks   Status Achieved     PT SHORT TERM GOAL #2   Title DF ROM to at least 5 deg passively    Baseline visually more than 0   Time 4    Period Weeks   Status On-going           PT Long Term Goals - 05/10/16 1646      PT LONG TERM GOAL #1   Title FOTO to 66% ability to indicate significant improvement in functional ability by 06/03/2016   Baseline 61% ability   Status On-going     PT LONG TERM GOAL #2   Title DF ROM to at least 10 deg for appropriate ROM for gait pattern   Baseline 10 deg passive   Status Achieved     PT LONG TERM GOAL #3   Title Pt will demo 5/5 MMT for all hip and ankle MMT to indicate appropraite support to joints by surrounding musculature   Baseline plantar flexion 3-/5   Status On-going     PT LONG TERM GOAL #4   Title Pt will demo ability to jog lighly in order to return to PLOF and prior exercise program, ankle pain <=2/10   Baseline unable due to lack of strength from gastroc/soleus   Status On-going               Plan - 05/12/16 1750    Clinical Impression Statement Focus continued on challanging her functional strength.  Hip strengthening also a focus.  No pain during sess however she had bottom of foot "stone "pain yesterday morning lasting 15 minutes.  Stretching also a focus so this does not develop into a chronic issue.   PT Next Visit Plan squat balance with flat feet, plantarflexion strength.  review hip exercises   PT Home Exercise Plan long sitting DF stretch, ankle ABCs & toe scrunches elevated, SLS, resisted PF, wall squat, single leg squat; bilateral heel raise & L leg lower, resisted PF black tband,  hip abduction and clam.   Consulted and Agree with Plan of Care Patient      Patient will benefit from skilled therapeutic intervention in order to improve the following deficits and impairments:  Abnormal gait, Decreased range of motion, Difficulty walking, Increased fascial restricitons, Decreased activity tolerance, Pain, Impaired flexibility, Decreased scar mobility, Decreased balance, Decreased strength  Visit Diagnosis: Pain in left ankle and joints of left  foot  Stiffness of left ankle, not elsewhere classified  Localized edema  Difficulty in walking, not elsewhere classified     Problem List Patient Active Problem List   Diagnosis Date Noted  . Electrolyte imbalance 04/01/2016  . Anemia 04/01/2016  . Thrombocytopenia (HCC) 04/01/2016  .  Acute respiratory failure with hypoxia (HCC) 04/01/2016  . Pulmonary embolism (HCC) 03/31/2016  . Rupture of left Achilles tendon 01/14/2016    Fausto Sampedro PTA 05/12/2016, 5:54 PM  [facility] [address] [place], [place], [address] Phone: [phone]   Fax:  [phone]  Name: Rosendo GrosShanika Herbig MRN: [id number] Date of Birth: 05/11/1980

## 2016-05-12 NOTE — Patient Instructions (Signed)
Abduction Lift    Lie on residual limb side. Tighten muscles on outside of hip to raise sound limb _2-3___ inches. Hold __1-2__ seconds. Repeat _10-30___ times. Do __1__ sessions per day.  Copyright  VHI. All rights reserved.  Abduction: Clam (Eccentric) - Side-Lying    Lie on side with knees bent. Lift top knee, keeping feet together. Keep trunk steady. Slowly lower for 3-5 seconds. 10___ reps per set, 1-3___ sets per day, _3-4__ days per week. .  http://ecce.exer.us/65   Copyright  VHI. All rights reserved.

## 2016-05-17 ENCOUNTER — Encounter: Payer: Self-pay | Admitting: Physical Therapy

## 2016-05-17 ENCOUNTER — Ambulatory Visit: Payer: 59 | Admitting: Physical Therapy

## 2016-05-17 DIAGNOSIS — M25572 Pain in left ankle and joints of left foot: Secondary | ICD-10-CM | POA: Diagnosis not present

## 2016-05-17 DIAGNOSIS — R262 Difficulty in walking, not elsewhere classified: Secondary | ICD-10-CM

## 2016-05-17 DIAGNOSIS — M25672 Stiffness of left ankle, not elsewhere classified: Secondary | ICD-10-CM

## 2016-05-17 DIAGNOSIS — R600 Localized edema: Secondary | ICD-10-CM

## 2016-05-17 NOTE — Therapy (Addendum)
[facility] [address] [place], [name], [address] Phone: [phone]   Fax:  [phone]  Physical Therapy Treatment  Patient Details  Name: Samantha Friedman MRN: [id number] Date of Birth: 05-09-1980 Referring Provider: Doneen Poisson, MD  Encounter Date: 05/17/2016      PT End of Session - 05/17/16 1618    Visit Number 14   Number of Visits 17   Date for PT Re-Evaluation 06/03/16   Authorization Type UHC   PT Start Time 1616   PT Stop Time 1712   PT Time Calculation (min) 56 min   Activity Tolerance Patient tolerated treatment well   Behavior During Therapy [facility] for tasks assessed/performed      Past Medical History:  Diagnosis Date  . Anemia   . Scoliosis     Past Surgical History:  Procedure Laterality Date  . ACHILLES TENDON SURGERY Left 01/14/2016   Procedure: DIRECT PRIMARY REPAIR LEFT ACHILLES TENDON;  Surgeon: Kathryne Hitch, MD;  Location: [facility];  Service: Orthopedics;  Laterality: Left;  . broken thumb     . FRACTURE SURGERY    . IR GENERIC HISTORICAL  03/31/2016   IR INFUSION THROMBOL ARTERIAL INITIAL (MS) 03/31/2016 Malachy Moan, MD [facility]  . IR GENERIC HISTORICAL  03/31/2016   IR ANGIOGRAM SELECTIVE EACH ADDITIONAL VESSEL 03/31/2016 Malachy Moan, MD [facility]  . IR GENERIC HISTORICAL  03/31/2016   IR ANGIOGRAM SELECTIVE EACH ADDITIONAL VESSEL 03/31/2016 Malachy Moan, MD [facility]  . IR GENERIC HISTORICAL  03/31/2016   IR ANGIOGRAM PULMONARY BILATERAL SELECTIVE 03/31/2016 Malachy Moan, MD [facility]  . IR GENERIC HISTORICAL  03/31/2016   IR US GUIDE VASC ACCESS RIGHT 03/31/2016 Malachy Moan, MD [facility]  . IR GENERIC HISTORICAL  03/31/2016   IR INFUSION THROMBOL ARTERIAL INITIAL (MS) 03/31/2016 Malachy Moan, MD [facility]  . IR GENERIC HISTORICAL  04/01/2016   IR THROMB F/U EVAL ART/VEN SUBSEQ DAY (MS) 04/01/2016 Oley Balm, MD [facility]     There were no vitals filed for this visit.      Subjective Assessment - 05/17/16 1619    Subjective Pt reports resolution of "stone" pain in heel. Denies pain or soreness with any exercises. "so-so" when trying to raise onto toes   Currently in Pain? No/denies            [facility] PT Assessment - 05/17/16 0001      Assessment   Medical Diagnosis L achilles repair   Referring Provider Doneen Poisson, MD   Onset Date/Surgical Date 01/14/16   Next MD Visit 07/21/16                     [facility] Adult PT Treatment/Exercise - 05/17/16 0001      Knee/Hip Exercises: Stretches   Passive Hamstring Stretch 2 reps;30 seconds   Passive Hamstring Stretch Limitations supine with green strap   Gastroc Stretch 2 reps;30 seconds   Gastroc Stretch Limitations slant board   Soleus Stretch 30 seconds   Soleus Stretch Limitations slant board     Knee/Hip Exercises: Aerobic   Stepper 5 min L5     Knee/Hip Exercises: Machines for Strengthening   Cybex Leg Press horiz leg press, 1 plate, 2 leg heel raise/L lower     Knee/Hip Exercises: Standing   SLS single leg sit to table x15, holding bar forward   Walking with Sports Cord 17 LBS on cable cross pulling forward and reverse , SBA, 10 feet 10  X     Knee/Hip Exercises: Supine   Bridges Limitations iso bridge with 5 heel raises each x10     Vasopneumatic   Number Minutes Vasopneumatic  15 minutes   Vasopnuematic Location  Ankle   Vasopneumatic Pressure Low   Vasopneumatic Temperature  34 deg     Ankle Exercises: Seated   Other Seated Ankle Exercises resisted PF green tband     Ankle Exercises: Standing   Other Standing Ankle Exercises BOSU bilat feet: a/p & lateral tilts   Other Standing Ankle Exercises bilateral heel raise, single lower x10 at counter     Ankle Exercises: Stretches   Soleus Stretch 30 seconds  end of session   Gastroc Stretch 30 seconds  end of session                 PT Education -  05/17/16 1619    Education provided Yes   Education Details exercise form/rationale   Person(s) Educated Patient   Methods Explanation;Tactile cues;Verbal cues;Demonstration   Comprehension Verbalized understanding;Returned demonstration;Verbal cues required;Tactile cues required;Need further instruction          PT Short Term Goals - 05/12/16 1753      PT SHORT TERM GOAL #1   Title Pt will demo proper heel toe gait pattern for at least 50 ft without use of AD by 3/30   Period Weeks   Status Achieved     PT SHORT TERM GOAL #2   Title DF ROM to at least 5 deg passively    Baseline visually more than 0   Time 4   Period Weeks   Status On-going           PT Long Term Goals - 05/10/16 1646      PT LONG TERM GOAL #1   Title FOTO to 66% ability to indicate significant improvement in functional ability by 06/03/2016   Baseline 61% ability   Status On-going     PT LONG TERM GOAL #2   Title DF ROM to at least 10 deg for appropriate ROM for gait pattern   Baseline 10 deg passive   Status Achieved     PT LONG TERM GOAL #3   Title Pt will demo 5/5 MMT for all hip and ankle MMT to indicate appropraite support to joints by surrounding musculature   Baseline plantar flexion 3-/5   Status On-going     PT LONG TERM GOAL #4   Title Pt will demo ability to jog lighly in order to return to PLOF and prior exercise program, ankle pain <=2/10   Baseline unable due to lack of strength from gastroc/soleus   Status On-going               Plan - 05/17/16 1659    Clinical Impression Statement Cont to demo lack of strength to perform standing heel raises on R foot and unable to jump due to this lack of strength. Will continue to improving ankle stability and endurance as well as strength in gastroc/soleus.    PT Next Visit Plan ankle proprioception, plantarflexion strength   PT Home Exercise Plan long sitting DF stretch, ankle ABCs & toe scrunches elevated, SLS, resisted PF, wall  squat, single leg squat; bilateral heel raise & L leg lower, resisted PF black tband,  hip abduction and clam.   Consulted and Agree with Plan of Care Patient      Patient will benefit from skilled therapeutic intervention in order to improve the following  deficits and impairments:     Visit Diagnosis: Pain in left ankle and joints of left foot  Localized edema  Stiffness of left ankle, not elsewhere classified  Difficulty in walking, not elsewhere classified     Problem List Patient Active Problem List   Diagnosis Date Noted  . Electrolyte imbalance 04/01/2016  . Anemia 04/01/2016  . Thrombocytopenia (HCC) 04/01/2016  . Acute respiratory failure with hypoxia (HCC) 04/01/2016  . Pulmonary embolism (HCC) 03/31/2016  . Rupture of left Achilles tendon 01/14/2016   Tifanny Dollens C. Lajada Janes PT, DPT 05/17/16 5:04 PM   [facility] [address] [place], [name], [address] Phone: [phone]   Fax:  [phone]  Name: Samantha Friedman MRN: [id number] Date of Birth: [birth year]

## 2016-05-19 ENCOUNTER — Ambulatory Visit: Payer: 59 | Admitting: Physical Therapy

## 2016-05-19 ENCOUNTER — Encounter: Payer: Self-pay | Admitting: Physical Therapy

## 2016-05-19 DIAGNOSIS — M25572 Pain in left ankle and joints of left foot: Secondary | ICD-10-CM | POA: Diagnosis not present

## 2016-05-19 DIAGNOSIS — R262 Difficulty in walking, not elsewhere classified: Secondary | ICD-10-CM

## 2016-05-19 DIAGNOSIS — M25672 Stiffness of left ankle, not elsewhere classified: Secondary | ICD-10-CM

## 2016-05-19 DIAGNOSIS — R600 Localized edema: Secondary | ICD-10-CM

## 2016-05-19 NOTE — Patient Instructions (Signed)
Issued from exercise drawer: Hip Band moving right leg,  Green band. Daily 10 X flexion, extension/ abduction  Clams with green band 10 x 2  Heel lowering on step.  Lift both heels high,  Keeping heel up stand on left leg,  Slowly lower.  10 x

## 2016-05-19 NOTE — Therapy (Signed)
[place], [place], [address] Phone: [phone]   Fax:  [phone]  Physical Therapy Treatment  Patient Details  Name: Samantha Friedman MRN: [id number] Date of Birth: Oct 08, 1980 Referring Provider: Jean Rosenthal, MD  Encounter Date: 05/19/2016      PT End of Session - 05/19/16 1738    Visit Number 14   Number of Visits 17   Date for PT Re-Evaluation 06/03/16   PT Start Time 1634   PT Stop Time 1720   PT Time Calculation (min) 46 min   Activity Tolerance Patient tolerated treatment well   Behavior During Therapy [facility] for tasks assessed/performed      Past Medical History:  Diagnosis Date  . Anemia   . Scoliosis     Past Surgical History:  Procedure Laterality Date  . ACHILLES TENDON SURGERY Left 01/14/2016   Procedure: DIRECT PRIMARY REPAIR LEFT ACHILLES TENDON;  Surgeon: Mcarthur Rossetti, MD;  Location: [facility];  Service: Orthopedics;  Laterality: Left;  . broken thumb     . FRACTURE SURGERY    . IR GENERIC HISTORICAL  03/31/2016   IR INFUSION THROMBOL ARTERIAL INITIAL (MS) 03/31/2016 Jacqulynn Cadet, MD [facility]  . IR GENERIC HISTORICAL  03/31/2016   IR ANGIOGRAM SELECTIVE EACH ADDITIONAL VESSEL 03/31/2016 Jacqulynn Cadet, MD [facility]  . IR GENERIC HISTORICAL  03/31/2016   IR ANGIOGRAM SELECTIVE EACH ADDITIONAL VESSEL 03/31/2016 Jacqulynn Cadet, MD [facility]  . IR GENERIC HISTORICAL  03/31/2016   IR ANGIOGRAM PULMONARY BILATERAL SELECTIVE 03/31/2016 Jacqulynn Cadet, MD [facility]  . IR GENERIC HISTORICAL  03/31/2016   IR US GUIDE VASC ACCESS RIGHT 03/31/2016 Jacqulynn Cadet, MD [facility]  . IR GENERIC HISTORICAL  03/31/2016   IR INFUSION THROMBOL ARTERIAL INITIAL (MS) 03/31/2016 Jacqulynn Cadet, MD [facility]  . IR GENERIC HISTORICAL  04/01/2016   IR THROMB F/U EVAL ART/VEN SUBSEQ DAY (MS) 04/01/2016 Arne Cleveland, MD [facility]    There were no vitals filed  for this visit.      Subjective Assessment - 05/19/16 1635    Subjective No pain .  Home exercises are going well.    Currently in Pain? No/denies   Pain Score 0-No pain                         [facility] Adult PT Treatment/Exercise - 05/19/16 0001      Knee/Hip Exercises: Stretches   Gastroc Stretch 3 reps;30 seconds   Soleus Stretch 30 seconds   Soleus Stretch Limitations slant board 1 X     Knee/Hip Exercises: Aerobic   Elliptical 5 min L1,  ramp 1     Knee/Hip Exercises: Machines for Strengthening   Cybex Leg Press 1,2 plates single toes single, then 2 plates toe lifts, heel stretch  10 x2      Knee/Hip Exercises: Standing   Heel Raises 10 reps   Heel Raises Limitations on step up 2, left lower,  HEP   Hip Flexion 1 set;10 reps  green band,  moving right LE  HEP   Hip Abduction 1 set;10 reps   Abduction Limitations green band, moving right leg, HEP   Hip Extension 1 set;10 reps   Extension Limitations green band moving right leg.,  HEP   Rocker Board 5 minutes  2 minutes left only   Other Standing Knee Exercises BOSU stsnd to squat 10 x,  tandem static and with reaching.  Knee/Hip Exercises: Sidelying   Clams 2 sets 10 reps green band                PT Education - 05/19/16 1737    Education provided Yes   Education Details HEP   Person(s) Educated Patient   Methods Demonstration;Handout   Comprehension Verbalized understanding;Returned demonstration          PT Short Term Goals - 05/19/16 1741      PT SHORT TERM GOAL #1   Title Pt will demo proper heel toe gait pattern for at least 50 ft without use of AD by 3/30   Status Achieved     PT SHORT TERM GOAL #2   Title DF ROM to at least 5 deg passively    Baseline more than 5 visually assessed   Time 4   Period Weeks   Status Achieved           PT Long Term Goals - 05/10/16 1646      PT LONG TERM GOAL #1   Title FOTO to 66% ability to indicate significant improvement in  functional ability by 06/03/2016   Baseline 61% ability   Status On-going     PT LONG TERM GOAL #2   Title DF ROM to at least 10 deg for appropriate ROM for gait pattern   Baseline 10 deg passive   Status Achieved     PT LONG TERM GOAL #3   Title Pt will demo 5/5 MMT for all hip and ankle MMT to indicate appropraite support to joints by surrounding musculature   Baseline plantar flexion 3-/5   Status On-going     PT LONG TERM GOAL #4   Title Pt will demo ability to jog lighly in order to return to PLOF and prior exercise program, ankle pain <=2/10   Baseline unable due to lack of strength from gastroc/soleus   Status On-going               Plan - 05/19/16 1738    Clinical Impression Statement Progress toward HEP goal . Focus on endurance, ankle stability and strengthening.  STG#2 met.  No pain at end of session.     PT Next Visit Plan ankle proprioception, plantarflexion strength   PT Home Exercise Plan long sitting DF stretch, ankle ABCs & toe scrunches elevated, SLS, resisted PF, wall squat, single leg squat; bilateral heel raise & L leg lower, resisted PF black tband,  hip abduction and clam.   Consulted and Agree with Plan of Care Patient      Patient will benefit from skilled therapeutic intervention in order to improve the following deficits and impairments:  Abnormal gait, Decreased range of motion, Difficulty walking, Increased fascial restricitons, Decreased activity tolerance, Pain, Impaired flexibility, Decreased scar mobility, Decreased balance, Decreased strength  Visit Diagnosis: Pain in left ankle and joints of left foot  Localized edema  Stiffness of left ankle, not elsewhere classified  Difficulty in walking, not elsewhere classified     Problem List Patient Active Problem List   Diagnosis Date Noted  . Electrolyte imbalance 04/01/2016  . Anemia 04/01/2016  . Thrombocytopenia ([place]) 04/01/2016  . Acute respiratory failure with hypoxia ([place])  04/01/2016  . Pulmonary embolism ([place]) 03/31/2016  . Rupture of left Achilles tendon 01/14/2016    Shanicqua Coldren PTA 05/19/2016, 5:43 PM  [facility] [address] [place], [place], [address] Phone: [phone]   Fax:  [phone]  Name: Samantha Friedman MRN: [id number]  Date of Birth: 1980/01/18

## 2016-05-24 ENCOUNTER — Encounter: Payer: Self-pay | Admitting: Physical Therapy

## 2016-05-24 ENCOUNTER — Ambulatory Visit: Payer: 59 | Admitting: Physical Therapy

## 2016-05-24 DIAGNOSIS — M25672 Stiffness of left ankle, not elsewhere classified: Secondary | ICD-10-CM

## 2016-05-24 DIAGNOSIS — M25572 Pain in left ankle and joints of left foot: Secondary | ICD-10-CM | POA: Diagnosis not present

## 2016-05-24 DIAGNOSIS — R262 Difficulty in walking, not elsewhere classified: Secondary | ICD-10-CM

## 2016-05-24 DIAGNOSIS — R600 Localized edema: Secondary | ICD-10-CM

## 2016-05-24 NOTE — Therapy (Signed)
[facility] [address] [place], [name], [address] Phone: [phone]   Fax:  [phone]  Physical Therapy Treatment  Patient Details  Name: Samantha Friedman MRN: [id number] Date of Birth: 1980-12-07 Referring Provider: Doneen Poisson, MD  Encounter Date: 05/24/2016      PT End of Session - 05/24/16 1632    Visit Number 16  corrected visit count   Number of Visits 17   Date for PT Re-Evaluation 06/03/16   Authorization Type U[employer]   PT Start Time 1630   PT Stop Time 1720   PT Time Calculation (min) 50 min   Activity Tolerance Patient tolerated treatment well   Behavior During Therapy [facility] for tasks assessed/performed      Past Medical History:  Diagnosis Date  . Anemia   . Scoliosis     Past Surgical History:  Procedure Laterality Date  . ACHILLES TENDON SURGERY Left 01/14/2016   Procedure: DIRECT PRIMARY REPAIR LEFT ACHILLES TENDON;  Surgeon: Kathryne Hitch, MD;  Location: [facility];  Service: Orthopedics;  Laterality: Left;  . broken thumb     . FRACTURE SURGERY    . IR GENERIC HISTORICAL  03/31/2016   IR INFUSION THROMBOL ARTERIAL INITIAL (MS) 03/31/2016 Malachy Moan, MD [facility]  . IR GENERIC HISTORICAL  03/31/2016   IR ANGIOGRAM SELECTIVE EACH ADDITIONAL VESSEL 03/31/2016 Malachy Moan, MD [facility]  . IR GENERIC HISTORICAL  03/31/2016   IR ANGIOGRAM SELECTIVE EACH ADDITIONAL VESSEL 03/31/2016 Malachy Moan, MD [facility]  . IR GENERIC HISTORICAL  03/31/2016   IR ANGIOGRAM PULMONARY BILATERAL SELECTIVE 03/31/2016 Malachy Moan, MD [facility]  . IR GENERIC HISTORICAL  03/31/2016   IR US GUIDE VASC ACCESS RIGHT 03/31/2016 Malachy Moan, MD [facility]  . IR GENERIC HISTORICAL  03/31/2016   IR INFUSION THROMBOL ARTERIAL INITIAL (MS) 03/31/2016 Malachy Moan, MD [facility]  . IR GENERIC HISTORICAL  04/01/2016   IR THROMB F/U EVAL ART/VEN SUBSEQ DAY (MS) 04/01/2016 Oley Balm,  MD [facility]    There were no vitals filed for this visit.      Subjective Assessment - 05/24/16 1633    Subjective Denies pain. Is still working on plantarflexion strength at home. Feels stiff when she wakes up in the morning.    Currently in Pain? No/denies                         [facility] Adult PT Treatment/Exercise - 05/24/16 0001      Exercises   Exercises Other Exercises   Other Exercises  reformer: 2 red heel raises bilat&single, 2R1B static bridges on toes, 2R sidelying press flat foot & in PF, 2R marching in PF on board, 2R heel raises from board     Knee/Hip Exercises: Stretches   Gastroc Stretch 2 reps;30 seconds   Gastroc Stretch Limitations slant board   Soleus Stretch 2 reps;30 seconds   Soleus Stretch Limitations slant board     Knee/Hip Exercises: Aerobic   Stepper 5 min L5     Knee/Hip Exercises: Machines for Strengthening   Cybex Leg Press 2 plates, bilateral heel raise, single leg lower     Cryotherapy   Number Minutes Cryotherapy 10 Minutes   Cryotherapy Location Ankle   Type of Cryotherapy Ice pack     Ankle Exercises: Seated   Other Seated Ankle Exercises long sitting resisted DF green tband  PT Education - 05/24/16 1634    Education provided Yes   Education Details exercise form/rationale   Person(s) Educated Patient   Methods Explanation;Demonstration;Tactile cues;Verbal cues   Comprehension Verbalized understanding;Returned demonstration;Verbal cues required;Tactile cues required;Need further instruction          PT Short Term Goals - 05/19/16 1741      PT SHORT TERM GOAL #1   Title Pt will demo proper heel toe gait pattern for at least 50 ft without use of AD by 3/30   Status Achieved     PT SHORT TERM GOAL #2   Title DF ROM to at least 5 deg passively    Baseline more than 5 visually assessed   Time 4   Period Weeks   Status Achieved           PT Long Term Goals - 05/10/16 1646       PT LONG TERM GOAL #1   Title FOTO to 66% ability to indicate significant improvement in functional ability by 06/03/2016   Baseline 61% ability   Status On-going     PT LONG TERM GOAL #2   Title DF ROM to at least 10 deg for appropriate ROM for gait pattern   Baseline 10 deg passive   Status Achieved     PT LONG TERM GOAL #3   Title Pt will demo 5/5 MMT for all hip and ankle MMT to indicate appropraite support to joints by surrounding musculature   Baseline plantar flexion 3-/5   Status On-going     PT LONG TERM GOAL #4   Title Pt will demo ability to jog lighly in order to return to PLOF and prior exercise program, ankle pain <=2/10   Baseline unable due to lack of strength from gastroc/soleus   Status On-going               Plan - 05/24/16 1711    Clinical Impression Statement pt cont to have significant difficulty with plantarflexion strength. Utilized reformer today to challenge due to pt reporting "not feeling it" using leg press machine. Did well but still lacking ability to raise up onto toes. I asked pt to work on single leg heel raises as well as bilateral raise with single lower at home.    PT Next Visit Plan ankle proprioception, plantarflexion strength   PT Home Exercise Plan long sitting DF stretch, ankle ABCs & toe scrunches elevated, SLS, resisted PF, wall squat, single leg squat; bilateral heel raise & L leg lower, resisted PF black tband,  hip abduction and clam.   Consulted and Agree with Plan of Care Patient      Patient will benefit from skilled therapeutic intervention in order to improve the following deficits and impairments:     Visit Diagnosis: Pain in left ankle and joints of left foot  Localized edema  Stiffness of left ankle, not elsewhere classified  Difficulty in walking, not elsewhere classified     Problem List Patient Active Problem List   Diagnosis Date Noted  . Electrolyte imbalance 04/01/2016  . Anemia 04/01/2016  .  Thrombocytopenia ([facility]) 04/01/2016  . Acute respiratory failure with hypoxia ([facility]) 04/01/2016  . Pulmonary embolism ([facility]) 03/31/2016  . Rupture of left Achilles tendon 01/14/2016    Richard Holz C. Telly Broberg PT, DPT 05/24/16 5:14 PM   [facility] [address] [place], [place], [address] Phone: [phone]   Fax:  [phone]  Name: Rosendo GrosShanika Helseth MRN: [id number] Date of Birth:  04/14/1980   

## 2016-05-26 ENCOUNTER — Encounter: Payer: Self-pay | Admitting: Physical Therapy

## 2016-05-26 ENCOUNTER — Ambulatory Visit: Payer: 59 | Admitting: Physical Therapy

## 2016-05-26 DIAGNOSIS — R262 Difficulty in walking, not elsewhere classified: Secondary | ICD-10-CM

## 2016-05-26 DIAGNOSIS — M25672 Stiffness of left ankle, not elsewhere classified: Secondary | ICD-10-CM

## 2016-05-26 DIAGNOSIS — M25572 Pain in left ankle and joints of left foot: Secondary | ICD-10-CM

## 2016-05-26 DIAGNOSIS — R600 Localized edema: Secondary | ICD-10-CM

## 2016-05-26 NOTE — Therapy (Signed)
[place], [place], [address] Phone: [phone]   Fax:  [phone]  Physical Therapy Treatment/Discharge Summary  Patient Details  Name: Samantha Friedman MRN: [id number] Date of Birth: May 21, 1980 Referring Provider: Jean Rosenthal, MD  Encounter Date: 05/26/2016      PT End of Session - 05/26/16 1630    Visit Number 17   Number of Visits 17   Date for PT Re-Evaluation 06/03/16   Authorization Type UHC   PT Start Time 1631   PT Stop Time 1706   PT Time Calculation (min) 35 min   Activity Tolerance Patient tolerated treatment well   Behavior During Therapy [facility] for tasks assessed/performed      Past Medical History:  Diagnosis Date  . Anemia   . Scoliosis     Past Surgical History:  Procedure Laterality Date  . ACHILLES TENDON SURGERY Left 01/14/2016   Procedure: DIRECT PRIMARY REPAIR LEFT ACHILLES TENDON;  Surgeon: Mcarthur Rossetti, MD;  Location: [facility];  Service: Orthopedics;  Laterality: Left;  . broken thumb     . FRACTURE SURGERY    . IR GENERIC HISTORICAL  03/31/2016   IR INFUSION THROMBOL ARTERIAL INITIAL (MS) 03/31/2016 Jacqulynn Cadet, MD [facility]  . IR GENERIC HISTORICAL  03/31/2016   IR ANGIOGRAM SELECTIVE EACH ADDITIONAL VESSEL 03/31/2016 Jacqulynn Cadet, MD [facility]  . IR GENERIC HISTORICAL  03/31/2016   IR ANGIOGRAM SELECTIVE EACH ADDITIONAL VESSEL 03/31/2016 Jacqulynn Cadet, MD [facility]  . IR GENERIC HISTORICAL  03/31/2016   IR ANGIOGRAM PULMONARY BILATERAL SELECTIVE 03/31/2016 Jacqulynn Cadet, MD [facility]  . IR GENERIC HISTORICAL  03/31/2016   IR US GUIDE VASC ACCESS RIGHT 03/31/2016 Jacqulynn Cadet, MD [facility]  . IR GENERIC HISTORICAL  03/31/2016   IR INFUSION THROMBOL ARTERIAL INITIAL (MS) 03/31/2016 Jacqulynn Cadet, MD [facility]  . IR GENERIC HISTORICAL  04/01/2016   IR THROMB F/U EVAL ART/VEN SUBSEQ DAY (MS) 04/01/2016 Arne Cleveland, MD  [facility]    There were no vitals filed for this visit.      Subjective Assessment - 05/26/16 1631    Subjective Denies pain today. Ready to perform HEP independently.    Currently in Pain? No/denies            [facility] PT Assessment - 05/26/16 0001      Assessment   Medical Diagnosis L achilles repair   Referring Provider Jean Rosenthal, MD   Onset Date/Surgical Date 01/14/16   Next MD Visit 07/21/16     PROM   Left Ankle Dorsiflexion 10     Strength   Left Ankle Plantar Flexion 3+/5     Palpation   Palpation comment continues to wear cushion in sock to guard against shoe     Ambulation/Gait   Gait Comments walking gait pattern [facility]                     [facility] Adult PT Treatment/Exercise - 05/26/16 0001      Exercises   Other Exercises  see exercises scanned into "patient instructions"     Knee/Hip Exercises: Aerobic   Stepper 5 min L7                PT Education - 05/26/16 1710    Education provided Yes   Education Details exercise form/rationale, HEP, importance of continued strength, mobility throughout the day, RICE, progression to running   Person(s) Educated Patient   Methods Explanation;Demonstration;Tactile cues;Verbal cues;Handout  Comprehension Verbalized understanding;Returned demonstration;Tactile cues required;Verbal cues required          PT Short Term Goals - 05/19/16 1741      PT SHORT TERM GOAL #1   Title Pt will demo proper heel toe gait pattern for at least 50 ft without use of AD by 3/30   Status Achieved     PT SHORT TERM GOAL #2   Title DF ROM to at least 5 deg passively    Baseline more than 5 visually assessed   Time 4   Period Weeks   Status Achieved           PT Long Term Goals - 05/26/16 1635      PT LONG TERM GOAL #1   Title FOTO to 66% ability to indicate significant improvement in functional ability by 06/03/2016   Baseline 61% ability, improved from 42% at eval   Status Partially  Met     PT LONG TERM GOAL #2   Title DF ROM to at least 10 deg for appropriate ROM for gait pattern   Baseline 10 deg passive   Status Achieved     PT LONG TERM GOAL #3   Title Pt will demo 5/5 MMT for all hip and ankle MMT to indicate appropraite support to joints by surrounding musculature   Baseline plantar flexion 3/5   Status Partially Met     PT LONG TERM GOAL #4   Title Pt will demo ability to jog lighly in order to return to PLOF and prior exercise program, ankle pain <=2/10   Baseline able to jog with inappropraite pattern due to lack of strength but no pain   Status Partially Met               Plan - 05/26/16 1711    Clinical Impression Statement Pt has made significant progress in PT. Only limitation at this point is full plantarflexion strength in gastroc/soleus. Pt does not have any pain with exercises or with attempted jogging. Pt is able to peroform HEP exercises with difficulty but is prepared to dothem at home. Pt was instructed to contact us with any further questions.       Patient will benefit from skilled therapeutic intervention in order to improve the following deficits and impairments:  Abnormal gait, Decreased range of motion, Difficulty walking, Increased fascial restricitons, Decreased activity tolerance, Pain, Impaired flexibility, Decreased scar mobility, Decreased balance, Decreased strength  Visit Diagnosis: Pain in left ankle and joints of left foot  Localized edema  Stiffness of left ankle, not elsewhere classified  Difficulty in walking, not elsewhere classified     Problem List Patient Active Problem List   Diagnosis Date Noted  . Electrolyte imbalance 04/01/2016  . Anemia 04/01/2016  . Thrombocytopenia ([place]) 04/01/2016  . Acute respiratory failure with hypoxia ([place]) 04/01/2016  . Pulmonary embolism ([place]) 03/31/2016  . Rupture of left Achilles tendon 01/14/2016   PHYSICAL THERAPY DISCHARGE SUMMARY  Visits from Start of Care:  17  Current functional level related to goals / functional outcomes: See above   Remaining deficits: See above   Education / Equipment: Anatomy of condition, POC, HEP, exercise form/rationale  Plan: Patient agrees to discharge.  Patient goals were partially met. Patient is being discharged due to being pleased with the current functional level.  ?????      Lemario Chaikin C. Eliora Nienhuis PT, DPT 05/26/16 5:16 PM   [facility] [address] [place], [place], [address]  Phone: [phone]   Fax:  [phone]  Name: Samantha Friedman MRN: [id number] Date of Birth: 1980-10-05

## 2016-05-31 ENCOUNTER — Ambulatory Visit: Payer: 59 | Admitting: Physical Therapy

## 2016-06-02 ENCOUNTER — Ambulatory Visit: Payer: 59 | Admitting: Physical Therapy

## 2016-07-21 ENCOUNTER — Ambulatory Visit (INDEPENDENT_AMBULATORY_CARE_PROVIDER_SITE_OTHER): Payer: 59 | Admitting: Orthopaedic Surgery

## 2016-07-21 DIAGNOSIS — Z9889 Other specified postprocedural states: Secondary | ICD-10-CM

## 2016-07-21 NOTE — Progress Notes (Signed)
The patient is now 6 months status post a left Achilles tendon repair. Her postoperative course compensated by pulmonary embolism that occurred almost 2 months postoperative. She's been to physical therapy now she still blood thinner for at least 3 or more months she states. She doing well overall.  She's been to physical therapy as well. There is still swelling of the back of her ankle and scar tissue foot the incision is healed completely. Her Janee Mornhompson test is negative but her Achilles is still weak.  At this point she'll continue increase her activities as comfort allows. She has this can take a year to completely recover from. She'll work on stretching exercises and still strengthening exercises. She can resume working out in [employer] gym as well. All questions were encouraged and answered. She'll follow-up as needed.

## 2016-10-10 ENCOUNTER — Ambulatory Visit (INDEPENDENT_AMBULATORY_CARE_PROVIDER_SITE_OTHER): Payer: 59 | Admitting: Orthopaedic Surgery

## 2016-10-10 DIAGNOSIS — S86012D Strain of left Achilles tendon, subsequent encounter: Secondary | ICD-10-CM | POA: Diagnosis not present

## 2016-10-10 NOTE — Progress Notes (Signed)
Patient is someone who we performed a direct primary repair of her left Achilles tendon 9 months ago. She had no issues but recently developed swelling with no pain. She saw her primary care physician who is started on antibiotic. She has to go pick this up.  On examination of her left Achilles there is swelling of the soft tissue around the posterior ankle incision but there is no redness. I cannot express any purulence from this area. Her [name] test is negative.  I agree with the antibiotics but this may be an inflammatory response from the FiberWire suture. I want see her back in a week for recheck. We'll have her place Bactroban ointment on the area daily in the interim.

## 2016-10-17 ENCOUNTER — Ambulatory Visit (INDEPENDENT_AMBULATORY_CARE_PROVIDER_SITE_OTHER): Payer: 59 | Admitting: Orthopaedic Surgery

## 2016-10-17 DIAGNOSIS — Z9889 Other specified postprocedural states: Secondary | ICD-10-CM

## 2016-10-17 NOTE — Progress Notes (Signed)
The patient is coming in today for me to check her left ankle again. She has 9 months out from a direct primary repair of Achilles tendon rupture. She's been doing well but then developed some swelling around the incision. It seemed to me that this is potentially more of an inflammatory response from the FiberWire suture that an actual infection. She's been on antibiotics.  On examination there is no redness and no purulent drainage. There is swelling there were no dysuria it does appear there may be an inflammatory response.  She'll finish out the antibiotics on Thursday and I want her off antibiotics for a week. She'll continue Bactroban ointment daily. She'll also continue compressive garment. I would like to reevaluate her in 2 weeks.

## 2016-10-31 ENCOUNTER — Ambulatory Visit (INDEPENDENT_AMBULATORY_CARE_PROVIDER_SITE_OTHER): Payer: 59 | Admitting: Orthopaedic Surgery

## 2016-10-31 DIAGNOSIS — Z9889 Other specified postprocedural states: Secondary | ICD-10-CM | POA: Diagnosis not present

## 2016-10-31 NOTE — Progress Notes (Signed)
The patient is now 10 months status post an Achilles tendon repair of her left Achilles tendon.  Her postoperative course was complicated by DVT.  She is now off all blood thinners as of yesterday.  We have seen her back recently due to inflammatory respons from I believe the FiberWire sutures that were used to repair the Achilles.  She was having inflammatory swelling around the incision but no evidence of infection.  She is having swelling as well.  On examination today her Achilles is intact.  The left side does show some slight swelling but there is no redness or drainage at all.  She will continue to work on wound care and strength in her Achilles.  At this point she is doing well enough that she can follow-up as needed however if at any point he still having problems with her Achilles area or swelling I would need to see her back.  She understands this fully and all questions and concerns were answered and addressed.

## 2017-04-26 DIAGNOSIS — Z0000 Encounter for general adult medical examination without abnormal findings: Secondary | ICD-10-CM | POA: Diagnosis not present

## 2017-04-26 DIAGNOSIS — E785 Hyperlipidemia, unspecified: Secondary | ICD-10-CM | POA: Diagnosis not present

## 2017-04-26 DIAGNOSIS — E559 Vitamin D deficiency, unspecified: Secondary | ICD-10-CM | POA: Diagnosis not present

## 2017-04-26 DIAGNOSIS — Z0110 Encounter for examination of ears and hearing without abnormal findings: Secondary | ICD-10-CM | POA: Diagnosis not present

## 2017-04-26 DIAGNOSIS — Z131 Encounter for screening for diabetes mellitus: Secondary | ICD-10-CM | POA: Diagnosis not present

## 2017-04-26 DIAGNOSIS — I2699 Other pulmonary embolism without acute cor pulmonale: Secondary | ICD-10-CM | POA: Diagnosis not present

## 2017-04-26 DIAGNOSIS — Z136 Encounter for screening for cardiovascular disorders: Secondary | ICD-10-CM | POA: Diagnosis not present

## 2017-04-26 DIAGNOSIS — Z1389 Encounter for screening for other disorder: Secondary | ICD-10-CM | POA: Diagnosis not present

## 2017-05-08 DIAGNOSIS — E875 Hyperkalemia: Secondary | ICD-10-CM | POA: Diagnosis not present

## 2018-04-26 DIAGNOSIS — E785 Hyperlipidemia, unspecified: Secondary | ICD-10-CM | POA: Diagnosis not present

## 2018-04-26 DIAGNOSIS — E559 Vitamin D deficiency, unspecified: Secondary | ICD-10-CM | POA: Diagnosis not present

## 2018-04-26 DIAGNOSIS — Z131 Encounter for screening for diabetes mellitus: Secondary | ICD-10-CM | POA: Diagnosis not present

## 2018-04-26 DIAGNOSIS — I2699 Other pulmonary embolism without acute cor pulmonale: Secondary | ICD-10-CM | POA: Diagnosis not present

## 2018-04-26 DIAGNOSIS — Z0000 Encounter for general adult medical examination without abnormal findings: Secondary | ICD-10-CM | POA: Diagnosis not present

## 2018-04-26 DIAGNOSIS — Z0110 Encounter for examination of ears and hearing without abnormal findings: Secondary | ICD-10-CM | POA: Diagnosis not present

## 2018-04-26 DIAGNOSIS — Z136 Encounter for screening for cardiovascular disorders: Secondary | ICD-10-CM | POA: Diagnosis not present

## 2018-04-26 DIAGNOSIS — Z113 Encounter for screening for infections with a predominantly sexual mode of transmission: Secondary | ICD-10-CM | POA: Diagnosis not present

## 2019-02-16 IMAGING — CT CT ANGIO CHEST
2 of 6 series · 18 of 36 positions shown · IV contrast (isovue)
Comparison: None.

CLINICAL DATA: Hypoxia, hypotension and syncope. Chest pain. No
cough or fever. On birth control. Recent surgery 01/14/2016.

EXAM:
CT ANGIOGRAPHY CHEST WITH CONTRAST
TECHNIQUE: Multidetector CT imaging of the chest was performed using the
standard protocol during bolus administration of intravenous
contrast. Multiplanar CT image reconstructions and MIPs were
obtained to evaluate the vascular anatomy.
CONTRAST:  100 cc Isovue 370 IV

[Series 9: pe thins · axial · 0.63mm/px · z∈[-406,-192]mm · 17 of 242 slices shown]
[im 14/242  lung]
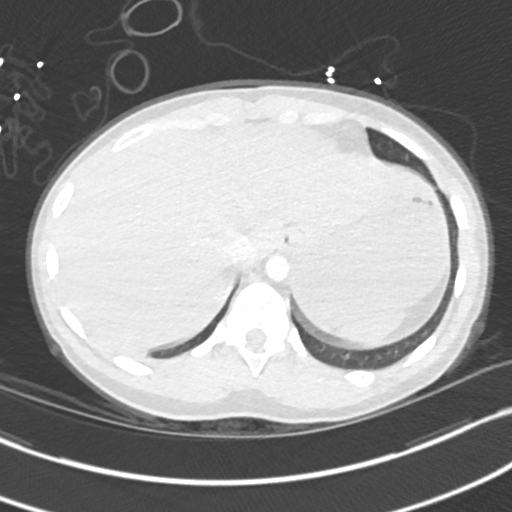
[im 27/242  mediastinal]
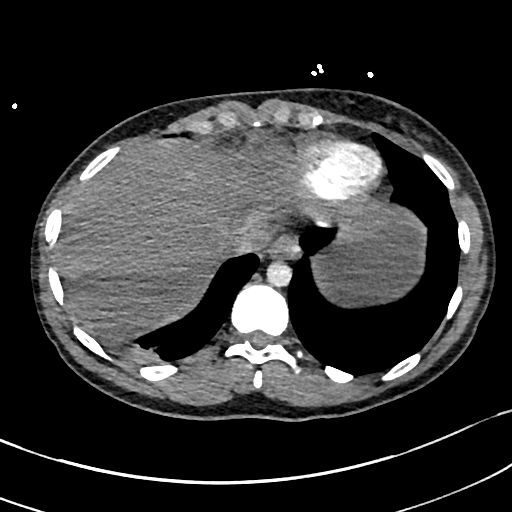
[im 41/242  lung]
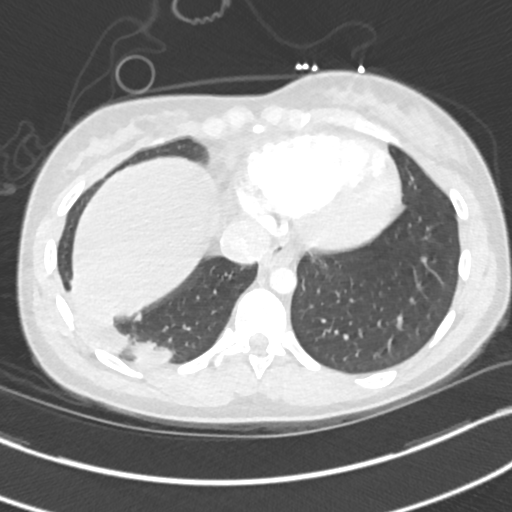
[im 54/242  mediastinal]
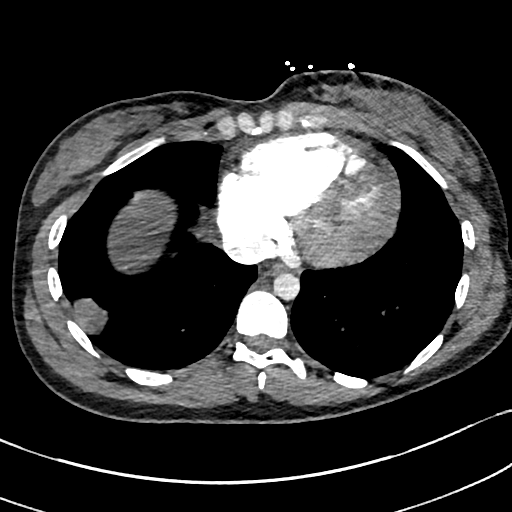
[im 67/242  lung]
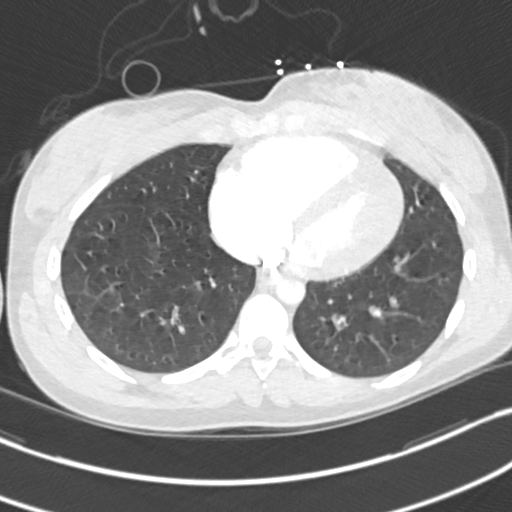
[im 81/242  mediastinal]
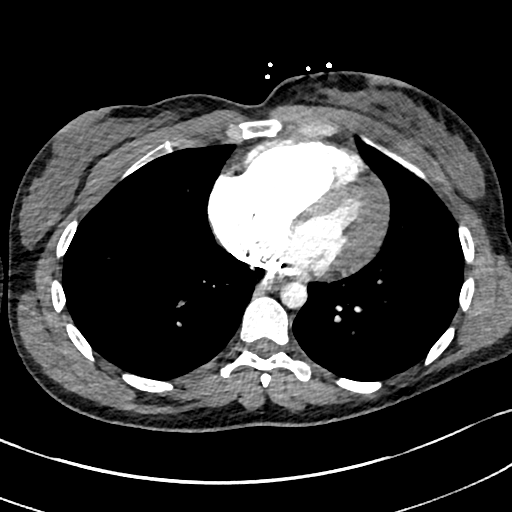
[im 94/242  lung]
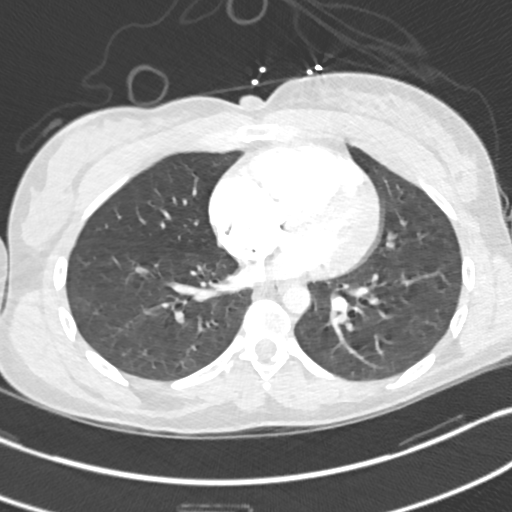
[im 108/242  mediastinal]
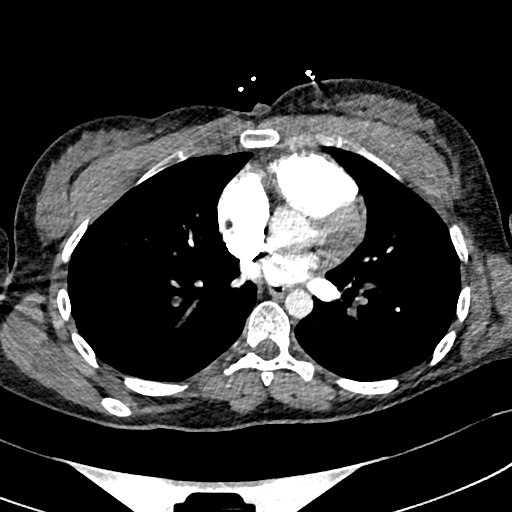
[im 121/242  lung]
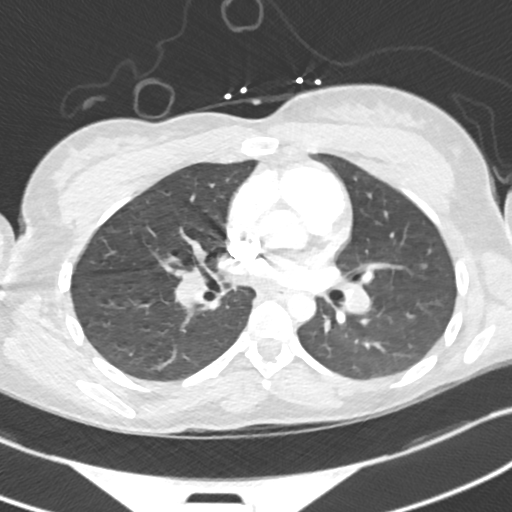
[im 134/242  mediastinal]
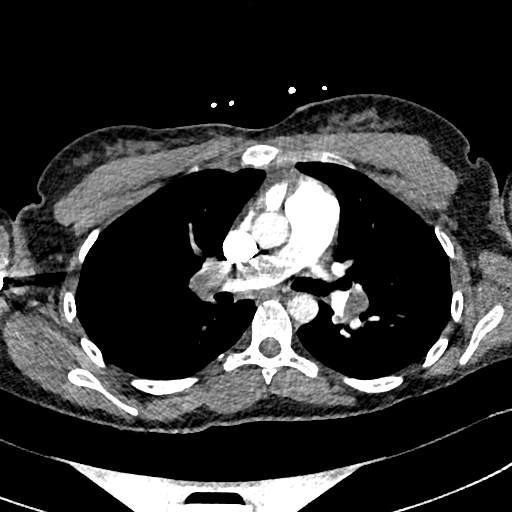
[im 148/242  lung]
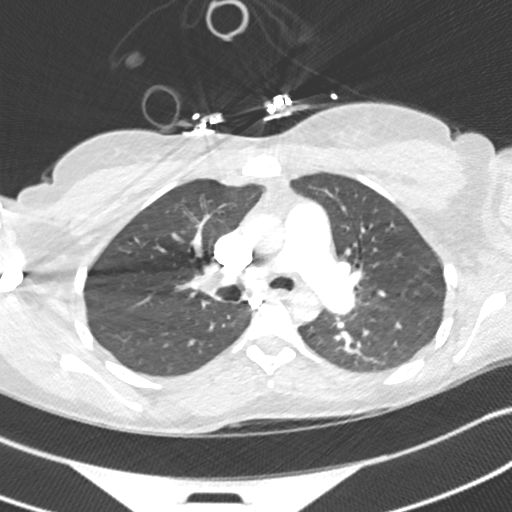
[im 161/242  mediastinal]
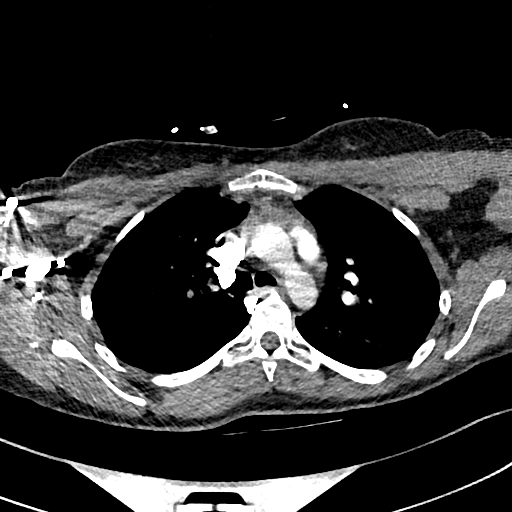
[im 175/242  lung]
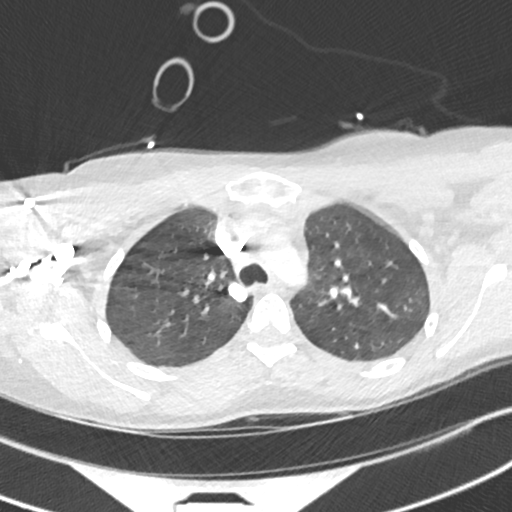
[im 188/242  mediastinal]
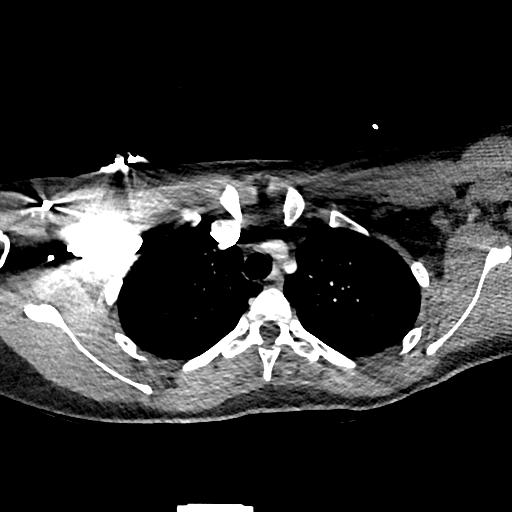
[im 201/242  lung]
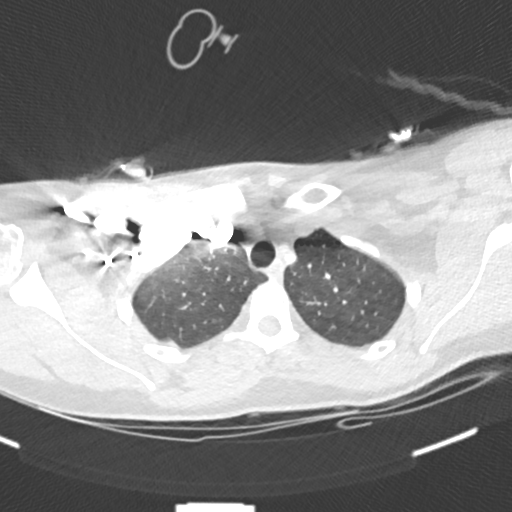
[im 215/242  mediastinal]
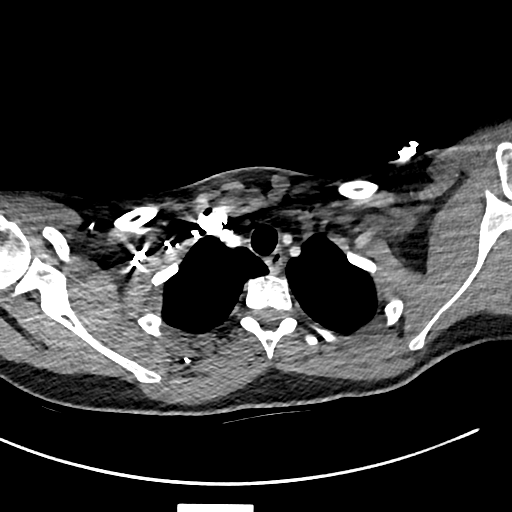
[im 228/242  lung]
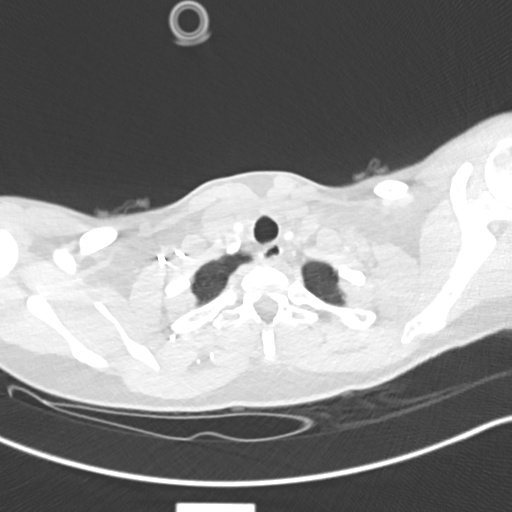

[Series 10: pe 2mm cor · coronal · 0.50mm/px · 1 of 117 slices shown]
[im 59/117  mediastinal]
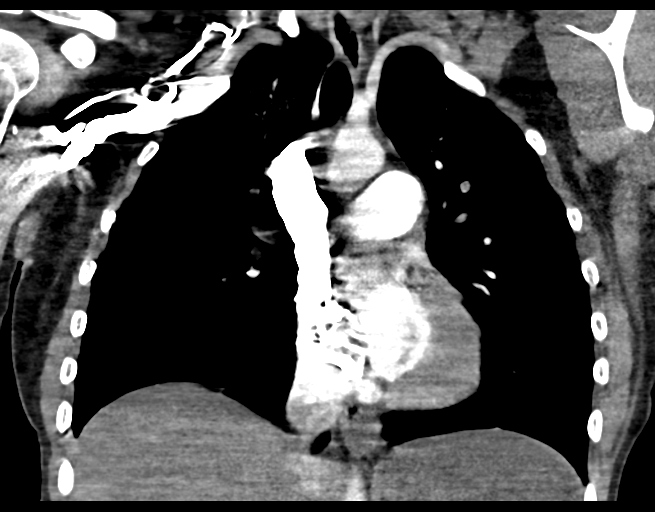

[18 of 36 positions shown; findings below may reference images not displayed]

FINDINGS: Cardiovascular: Acute bilateral multilobar pulmonary emboli with the
RV/LV ratio 1.6. Near saddle embolus with most of the thrombus seen
in the right main pulmonary artery. Cardiac chambers are normal in
size. No pericardial effusion is identified. No aortic aneurysm or
dissection.

Mediastinum/Nodes: No enlarged mediastinal, hilar, or axillary lymph
nodes. Thyroid gland, trachea, and esophagus demonstrate no
significant findings.

Lungs/Pleura: Parenchymal opacities in the periphery of the right
lower lobe more likely represent pulmonary infarcts as opposed
pneumonic consolidations given the clinical report of no fever and
cough.

Upper Abdomen: No acute abnormality.

Musculoskeletal: Small cutaneous nodules along anterior lower chest
wall. No acute or significant osseous findings.

Review of the MIP images confirms the above findings.
IMPRESSION: Positive for acute PE with CT evidence of right heart strain (RV/LV
Ratio = 1.6) consistent with at least submassive (intermediate risk)
PE. The presence of right heart strain has been associated with an
increased risk of morbidity and mortality. Please activate Code PE
by paging [phone]. Critical Value/emergent results were called
by telephone at the time of interpretation on 03/31/2016 at [DATE]
to Dr. MD ELIAS YAD , who verbally acknowledged these
results.

Peripheral right lower lobe pulmonary opacities consistent with
pulmonary infarct as opposed pneumonic consolidations given lack of
clinical symptoms for pneumonia after discussion with Dr. Nay.

Cutaneous nodules of the anterior chest wall overlying the lower
sternum.

## 2019-04-30 DIAGNOSIS — Z0000 Encounter for general adult medical examination without abnormal findings: Secondary | ICD-10-CM | POA: Diagnosis not present

## 2019-04-30 DIAGNOSIS — Z131 Encounter for screening for diabetes mellitus: Secondary | ICD-10-CM | POA: Diagnosis not present

## 2019-04-30 DIAGNOSIS — E559 Vitamin D deficiency, unspecified: Secondary | ICD-10-CM | POA: Diagnosis not present

## 2019-04-30 DIAGNOSIS — Z0110 Encounter for examination of ears and hearing without abnormal findings: Secondary | ICD-10-CM | POA: Diagnosis not present

## 2019-04-30 DIAGNOSIS — I10 Essential (primary) hypertension: Secondary | ICD-10-CM | POA: Diagnosis not present

## 2019-04-30 DIAGNOSIS — Z1389 Encounter for screening for other disorder: Secondary | ICD-10-CM | POA: Diagnosis not present

## 2019-04-30 DIAGNOSIS — E785 Hyperlipidemia, unspecified: Secondary | ICD-10-CM | POA: Diagnosis not present

## 2019-05-04 ENCOUNTER — Ambulatory Visit: Payer: Self-pay | Attending: Internal Medicine

## 2019-05-04 DIAGNOSIS — Z23 Encounter for immunization: Secondary | ICD-10-CM

## 2019-05-04 NOTE — Progress Notes (Signed)
   Covid-19 Vaccination Clinic  Name:  Samantha Friedman    MRN: [id number] DOB: [birth year]  05/04/2019  [name] was observed post Covid-19 immunization for 15 minutes without incident. She was provided with Vaccine Information Sheet and instruction to access the V-Safe system.   [name] was instructed to call 911 with any severe reactions post vaccine: Marland Kitchen Difficulty breathing  . Swelling of face and throat  . A fast heartbeat  . A bad rash all over body  . Dizziness and weakness   Immunizations Administered    Name Date Dose VIS Date Route   Pfizer COVID-19 Vaccine 05/04/2019  8:19 AM 0.3 mL 02/27/2018 Intramuscular   Manufacturer: [employer], [employer]   Lot: [id number]   NDC: [address]-7

## 2019-05-27 ENCOUNTER — Ambulatory Visit: Payer: Self-pay | Attending: Internal Medicine

## 2019-05-27 DIAGNOSIS — Z23 Encounter for immunization: Secondary | ICD-10-CM

## 2019-05-27 NOTE — Progress Notes (Signed)
   Covid-19 Vaccination Clinic  Name:  Samantha Friedman    MRN: [id number] DOB: 1980-06-25  05/27/2019  [name] was observed post Covid-19 immunization for 15 minutes without incident. She was provided with Vaccine Information Sheet and instruction to access the V-Safe system.   [name] was instructed to call 911 with any severe reactions post vaccine: Marland Kitchen Difficulty breathing  . Swelling of face and throat  . A fast heartbeat  . A bad rash all over body  . Dizziness and weakness   Immunizations Administered    Name Date Dose VIS Date Route   Pfizer COVID-19 Vaccine 05/27/2019  8:11 AM 0.3 mL 02/27/2018 Intramuscular   Manufacturer: [employer], [employer]   Lot: [id number]   NDC: [address]-0

## 2019-05-28 DIAGNOSIS — E785 Hyperlipidemia, unspecified: Secondary | ICD-10-CM | POA: Diagnosis not present

## 2019-05-28 DIAGNOSIS — E559 Vitamin D deficiency, unspecified: Secondary | ICD-10-CM | POA: Diagnosis not present

## 2019-05-28 DIAGNOSIS — I10 Essential (primary) hypertension: Secondary | ICD-10-CM | POA: Diagnosis not present

## 2019-07-04 ENCOUNTER — Ambulatory Visit (INDEPENDENT_AMBULATORY_CARE_PROVIDER_SITE_OTHER): Payer: BC Managed Care – PPO | Admitting: Nurse Practitioner

## 2019-07-04 ENCOUNTER — Encounter: Payer: Self-pay | Admitting: Nurse Practitioner

## 2019-07-04 ENCOUNTER — Other Ambulatory Visit: Payer: Self-pay

## 2019-07-04 VITALS — BP 120/83 | Ht 69.0 in | Wt 156.4 lb

## 2019-07-04 DIAGNOSIS — Z3009 Encounter for other general counseling and advice on contraception: Secondary | ICD-10-CM | POA: Diagnosis not present

## 2019-07-04 DIAGNOSIS — Z01419 Encounter for gynecological examination (general) (routine) without abnormal findings: Secondary | ICD-10-CM

## 2019-07-04 NOTE — Patient Instructions (Signed)
Health Maintenance, Female Adopting a healthy lifestyle and getting preventive care are important in promoting health and wellness. Ask your health care provider about:  The right schedule for you to have regular tests and exams.  Things you can do on your own to prevent diseases and keep yourself healthy. What should I know about diet, weight, and exercise? Eat a healthy diet   Eat a diet that includes plenty of vegetables, fruits, low-fat dairy products, and lean protein.  Do not eat a lot of foods that are high in solid fats, added sugars, or sodium. Maintain a healthy weight Body mass index (BMI) is used to identify weight problems. It estimates body fat based on height and weight. Your health care provider can help determine your BMI and help you achieve or maintain a healthy weight. Get regular exercise Get regular exercise. This is one of the most important things you can do for your health. Most adults should:  Exercise for at least 150 minutes each week. The exercise should increase your heart rate and make you sweat (moderate-intensity exercise).  Do strengthening exercises at least twice a week. This is in addition to the moderate-intensity exercise.  Spend less time sitting. Even light physical activity can be beneficial. Watch cholesterol and blood lipids Have your blood tested for lipids and cholesterol at 37 years of age, then have this test every 5 years. Have your cholesterol levels checked more often if:  Your lipid or cholesterol levels are high.  You are older than 37 years of age.  You are at high risk for heart disease. What should I know about cancer screening? Depending on your health history and family history, you may need to have cancer screening at various ages. This may include screening for:  Breast cancer.  Cervical cancer.  Colorectal cancer.  Skin cancer.  Lung cancer. What should I know about heart disease, diabetes, and high blood  pressure? Blood pressure and heart disease  High blood pressure causes heart disease and increases the risk of stroke. This is more likely to develop in people who have high blood pressure readings, are of African descent, or are overweight.  Have your blood pressure checked: ? Every 3-5 years if you are 18-39 years of age. ? Every year if you are 40 years old or older. Diabetes Have regular diabetes screenings. This checks your fasting blood sugar level. Have the screening done:  Once every three years after age 40 if you are at a normal weight and have a low risk for diabetes.  More often and at a younger age if you are overweight or have a high risk for diabetes. What should I know about preventing infection? Hepatitis B If you have a higher risk for hepatitis B, you should be screened for this virus. Talk with your health care provider to find out if you are at risk for hepatitis B infection. Hepatitis C Testing is recommended for:  Everyone born from 1945 through 1965.  Anyone with known risk factors for hepatitis C. Sexually transmitted infections (STIs)  Get screened for STIs, including gonorrhea and chlamydia, if: ? You are sexually active and are younger than 37 years of age. ? You are older than 37 years of age and your health care provider tells you that you are at risk for this type of infection. ? Your sexual activity has changed since you were last screened, and you are at increased risk for chlamydia or gonorrhea. Ask your health care provider if   you are at risk.  Ask your health care provider about whether you are at high risk for HIV. Your health care provider may recommend a prescription medicine to help prevent HIV infection. If you choose to take medicine to prevent HIV, you should first get tested for HIV. You should then be tested every 3 months for as long as you are taking the medicine. Pregnancy  If you are about to stop having your period (premenopausal) and  you may become pregnant, seek counseling before you get pregnant.  Take 400 to 800 micrograms (mcg) of folic acid every day if you become pregnant.  Ask for birth control (contraception) if you want to prevent pregnancy. Osteoporosis and menopause Osteoporosis is a disease in which the bones lose minerals and strength with aging. This can result in bone fractures. If you are 65 years old or older, or if you are at risk for osteoporosis and fractures, ask your health care provider if you should:  Be screened for bone loss.  Take a calcium or vitamin D supplement to lower your risk of fractures.  Be given hormone replacement therapy (HRT) to treat symptoms of menopause. Follow these instructions at home: Lifestyle  Do not use any products that contain nicotine or tobacco, such as cigarettes, e-cigarettes, and chewing tobacco. If you need help quitting, ask your health care provider.  Do not use street drugs.  Do not share needles.  Ask your health care provider for help if you need support or information about quitting drugs. Alcohol use  Do not drink alcohol if: ? Your health care provider tells you not to drink. ? You are pregnant, may be pregnant, or are planning to become pregnant.  If you drink alcohol: ? Limit how much you use to 0-1 drink a day. ? Limit intake if you are breastfeeding.  Be aware of how much alcohol is in your drink. In the [place] one drink equals one 12 oz bottle of beer (355 mL), one 5 oz glass of wine (148 mL), or one 1 oz glass of hard liquor (44 mL). General instructions  Schedule regular health, dental, and eye exams.  Stay current with your vaccines.  Tell your health care provider if: ? You often feel depressed. ? You have ever been abused or do not feel safe at home. Summary  Adopting a healthy lifestyle and getting preventive care are important in promoting health and wellness.  Follow your health care provider's instructions about healthy  diet, exercising, and getting tested or screened for diseases.  Follow your health care provider's instructions on monitoring your cholesterol and blood pressure. This information is not intended to replace advice given to you by your health care provider. Make sure you discuss any questions you have with your health care provider. Document Revised: 12/13/2017 Document Reviewed: 12/13/2017 Elsevier Patient Education  2020 Elsevier Inc.  

## 2019-07-04 NOTE — Addendum Note (Signed)
Addended by: Aura Camps on: 07/04/2019 09:11 AM   Modules accepted: Orders

## 2019-07-04 NOTE — Progress Notes (Signed)
   Samantha Friedman [birth year] [id number]   History:  39 y.o. SBF G0 presents for annual exam without GYN complaints.  Regular monthly cycle.  Was on OCPs in the past but developed a PE in 2018.  Not sexually active.  Not interested in contraception at this time.  Gynecologic History Patient's last menstrual period was 06/19/2019. Period Duration (Days): 5 DAYS Period Pattern: Regular Menstrual Flow: Moderate Menstrual Control: Thin pad Menstrual Control Change Freq (Hours): EVERY 4 HOURS Dysmenorrhea: (!) Mild Dysmenorrhea Symptoms: Cramping   Contraception: abstinence Last Pap: 09/29/2013. Results were: normal  Past medical history, past surgical history, family history and social history were all reviewed and documented in the EPIC chart.  ROS:  A ROS was performed and pertinent positives and negatives are included.  Exam:  Vitals:   07/04/19 0837  BP: 120/83  Weight: 156 lb 6.4 oz (70.9 kg)   Body mass index is 21.81 kg/m.  General appearance:  Normal Thyroid:  Symmetrical, normal in size, without palpable masses or nodularity. Respiratory  Auscultation:  Clear without wheezing or rhonchi Cardiovascular  Auscultation:  Regular rate, without rubs, murmurs or gallops  Edema/varicosities:  Not grossly evident Abdominal  Soft,nontender, without masses, guarding or rebound.  Liver/spleen:  No organomegaly noted  Hernia:  None appreciated  Skin  Inspection:  Grossly normal   Breasts: Examined lying and sitting.   Right: Without masses, retractions, discharge or axillary adenopathy.   Left: Without masses, retractions, discharge or axillary adenopathy. Gentitourinary   Inguinal/mons:  Normal without inguinal adenopathy  External genitalia:  Normal  BUS/Urethra/Skene's glands:  Normal  Vagina:  Normal  Cervix:  Normal  Uterus:  Normal in size, shape and contour.  Midline and mobile  Adnexa/parametria:     Rt: Without masses or tenderness.   Lt: Without masses or  tenderness.  Anus and perineum: Normal   Assessment/Plan:  39 y.o. G0 for annual exam.   Well female exam with routine gynecological exam - Education provided on SBEs, importance of preventative screenings, current guidelines, high calcium diet, regular exercise, and multivitamin daily. Pap with HPV typing today.   Encounter for counseling regarding contraception - Discussed options for progesterone-only contraception if she decides to be on any in the future. Recommend condom use until permanent partner if she becomes sexually active.   Follow up in 1 year for annual      Samantha Friedman [facility], 8:38 AM 07/04/2019

## 2019-07-11 LAB — HPV TYPE 16 AND 18/45 RNA
HPV Type 16 RNA: NOT DETECTED
HPV Type 18/45 RNA: NOT DETECTED

## 2019-07-11 LAB — PAP, TP IMAGING W/ HPV RNA, RFLX HPV TYPE 16,18/45: HPV DNA High Risk: DETECTED — AB

## 2019-08-08 DIAGNOSIS — I10 Essential (primary) hypertension: Secondary | ICD-10-CM | POA: Diagnosis not present

## 2019-08-08 DIAGNOSIS — Z86711 Personal history of pulmonary embolism: Secondary | ICD-10-CM | POA: Diagnosis not present

## 2019-08-15 DIAGNOSIS — Z86711 Personal history of pulmonary embolism: Secondary | ICD-10-CM | POA: Diagnosis not present

## 2019-08-15 DIAGNOSIS — I10 Essential (primary) hypertension: Secondary | ICD-10-CM | POA: Diagnosis not present

## 2019-08-15 DIAGNOSIS — Z0000 Encounter for general adult medical examination without abnormal findings: Secondary | ICD-10-CM | POA: Diagnosis not present

## 2019-11-14 DIAGNOSIS — Z86711 Personal history of pulmonary embolism: Secondary | ICD-10-CM | POA: Diagnosis not present

## 2019-11-14 DIAGNOSIS — I10 Essential (primary) hypertension: Secondary | ICD-10-CM | POA: Diagnosis not present

## 2019-12-19 DIAGNOSIS — I10 Essential (primary) hypertension: Secondary | ICD-10-CM | POA: Diagnosis not present

## 2019-12-19 DIAGNOSIS — Z86711 Personal history of pulmonary embolism: Secondary | ICD-10-CM | POA: Diagnosis not present

## 2020-03-20 ENCOUNTER — Encounter: Payer: Self-pay | Admitting: Cardiology

## 2020-03-20 ENCOUNTER — Other Ambulatory Visit: Payer: Self-pay

## 2020-03-20 ENCOUNTER — Ambulatory Visit: Payer: BC Managed Care – PPO | Admitting: Cardiology

## 2020-03-20 VITALS — BP 139/88 | HR 149 | Temp 96.8°F | Ht 69.0 in | Wt 146.0 lb

## 2020-03-20 DIAGNOSIS — R000 Tachycardia, unspecified: Secondary | ICD-10-CM

## 2020-03-20 DIAGNOSIS — I10 Essential (primary) hypertension: Secondary | ICD-10-CM | POA: Insufficient documentation

## 2020-03-20 NOTE — Progress Notes (Signed)
Patient referred by Trey Sailors, PA for tachycardia, hypertension  Subjective:   Samantha Friedman, female    DOB: Jan 04, 1980, 40 y.o.   MRN: [id number]   Chief Complaint  Patient presents with  . Tachycardia  . Coronary Artery Disease     HPI  40 y.o. African-American female with h/o provoked DVT and submassive PE (2018), referred for evaluation management of tachycardia and hypertension.  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  She used to be quite active prior to Audiological scientist surgery in 2018.  This was followed by a provoked DVT and acute submassive pulmonary embolism. She underwent EKOS treatment by IR, and was on Eliquis for 6 months thereafter.  Eventually she underwent hypercoagulability work-up at that time.  She was seen by PCP yesterday and was found to have tachycardia in 130s, along with blood pressure in 140s/80s.  Patient denies chest pain, shortness of breath, palpitations, leg edema, orthopnea, PND, TIA/syncope.  She currently walks about 3 miles over 2 hours at a leisurely pace without any symptoms.   Patient was prescribed metoprolol succinate 25 mg by her PCP, but she has not started it yet.   Past Medical History:  Diagnosis Date  . Anemia   . DVT (deep venous thrombosis) ([place]) 2018  . Pulmonary embolism ([place]) 2018  . Scoliosis      Past Surgical History:  Procedure Laterality Date  . ACHILLES TENDON SURGERY Left 01/14/2016   Procedure: DIRECT PRIMARY REPAIR LEFT ACHILLES TENDON;  Surgeon: Mcarthur Rossetti, MD;  Location: [facility];  Service: Orthopedics;  Laterality: Left;  . broken thumb     . FRACTURE SURGERY    . IR GENERIC HISTORICAL  03/31/2016   IR INFUSION THROMBOL ARTERIAL INITIAL ([facility]) 03/31/2016 Jacqulynn Cadet, MD [facility]  . IR GENERIC HISTORICAL  03/31/2016   IR ANGIOGRAM SELECTIVE EACH ADDITIONAL VESSEL 03/31/2016 Jacqulynn Cadet, MD [facility]  . IR GENERIC HISTORICAL  03/31/2016   IR ANGIOGRAM SELECTIVE EACH  ADDITIONAL VESSEL 03/31/2016 Jacqulynn Cadet, MD [facility]  . IR GENERIC HISTORICAL  03/31/2016   IR ANGIOGRAM PULMONARY BILATERAL SELECTIVE 03/31/2016 Jacqulynn Cadet, MD [facility]  . IR GENERIC HISTORICAL  03/31/2016   IR US GUIDE VASC ACCESS RIGHT 03/31/2016 Jacqulynn Cadet, MD [facility]  . IR GENERIC HISTORICAL  03/31/2016   IR INFUSION THROMBOL ARTERIAL INITIAL ([facility]) 03/31/2016 Jacqulynn Cadet, MD [facility]  . IR GENERIC HISTORICAL  04/01/2016   IR THROMB F/U EVAL ART/VEN SUBSEQ DAY ([facility]) 04/01/2016 Arne Cleveland, MD [facility]     Social History   Tobacco Use  Smoking Status Never Smoker  Smokeless Tobacco Never Used    Social History   Substance and Sexual Activity  Alcohol Use No     Family History  Problem Relation Age of Onset  . Diabetes Father   . Cancer Maternal Grandfather      Current Outpatient Medications on File Prior to Visit  Medication Sig Dispense Refill  . Multiple Vitamins-Minerals (MULTIVITAMIN WITH MINERALS) tablet Take 1 tablet by mouth daily.    . metoprolol succinate (TOPROL-XL) 25 MG 24 hr tablet Take 25 mg by mouth daily. (Patient not taking: Reported on 03/20/2020)     No current facility-administered medications on file prior to visit.    Cardiovascular and other pertinent studies:  EKG 03/20/2020: Sinus tachycardia 132 bpm Left atrial enlargement Diffuse nonspecific ST depression  Echocardiogram 04/02/2016: - Left ventricle: The cavity size was normal. Systolic function was  normal. The estimated ejection fraction was in the range of 55%  to 60%. Wall motion was normal; there were no regional wall  motion abnormalities. Left ventricular diastolic function  parameters were normal.  - Pulmonary arteries: Systolic pressure was mildly increased. PA  peak pressure: 33 mm Hg (S).   CTA PE 03/2016: Positive for acute PE with CT evidence of right heart strain (RV/LV Ratio = 1.6) consistent with at least submassive  (intermediate risk) PE. The presence of right heart strain has been associated with an increased risk of morbidity and mortality. Please activate Code PE by paging [phone]. Critical Value/emergent results were called by telephone at the time of interpretation on 03/31/2016 at 6:37 pm to Dr. Marda Stalker , who verbally acknowledged these results.  Peripheral right lower lobe pulmonary opacities consistent with pulmonary infarct as opposed pneumonic consolidations given lack of clinical symptoms for pneumonia after discussion with Dr. Sherry Ruffing.  Cutaneous nodules of the anterior chest wall overlying the lower sternum.    Recent labs: 08/15/2019: Glucose 87, BUN/Cr 7/0.91. EGFR 94. Na/K 138/4.5. Rest of the CMP normal H/H 12/39. MCV 97. Platelets 281 Chol 213, TG 59, HDL 79, LDL 119    Review of Systems  Cardiovascular: Negative for chest pain, dyspnea on exertion, leg swelling, palpitations and syncope.         Vitals:   03/20/20 1239 03/20/20 1244  BP: (!) 156/95 139/88  Pulse: (!) 163 (!) 149  Temp: (!) 96.8 F (36 C)   SpO2: 97% 97%     Body mass index is 21.56 kg/m. Filed Weights   03/20/20 1239  Weight: 146 lb (66.2 kg)     Objective:   Physical Exam Vitals and nursing note reviewed.  Constitutional:      General: She is not in acute distress. Neck:     Vascular: No JVD.  Cardiovascular:     Rate and Rhythm: Regular rhythm. Tachycardia present.     Heart sounds: Normal heart sounds. No murmur heard.   Pulmonary:     Effort: Pulmonary effort is normal.     Breath sounds: Normal breath sounds. No wheezing or rales.  Musculoskeletal:     Right lower leg: No edema.     Left lower leg: No edema.         Assessment & Recommendations:   40 y.o. African-American female with h/o provoked DVT and submassive PE (2018), referred for evaluation management of tachycardia and hypertension.  Tachycardia, hypertension: Blood pressure and  heart rate. At home.  No obvious symptoms to suggest PE.  No chest pain.  No shortness of breath.  However, given her prior history of DVT/PE, I will check D-dimer.  If elevated, she may benefit from low-dose anticoagulation indefinitely.  I asked her to start taking metoprolol tartrate 25 mg daily.  We will check echocardiogram, 1 week cardiac telemetry.  Further recommendations after above testing.  Thank you for referring the patient to [place]. Please feel free to contact with any questions.   Nigel Mormon, MD Pager: [phone] Office: [phone]

## 2020-03-31 ENCOUNTER — Ambulatory Visit: Payer: BC Managed Care – PPO

## 2020-03-31 ENCOUNTER — Inpatient Hospital Stay: Payer: BC Managed Care – PPO

## 2020-03-31 ENCOUNTER — Other Ambulatory Visit: Payer: Self-pay

## 2020-03-31 DIAGNOSIS — R000 Tachycardia, unspecified: Secondary | ICD-10-CM

## 2020-04-15 LAB — D-DIMER, QUANTITATIVE: D-DIMER: 0.26 mg/L FEU (ref 0.00–0.49)

## 2020-04-20 ENCOUNTER — Ambulatory Visit: Payer: BC Managed Care – PPO | Admitting: Cardiology

## 2020-04-24 ENCOUNTER — Other Ambulatory Visit: Payer: Self-pay

## 2020-04-24 ENCOUNTER — Ambulatory Visit: Payer: BC Managed Care – PPO | Admitting: Cardiology

## 2020-04-24 ENCOUNTER — Encounter: Payer: Self-pay | Admitting: Cardiology

## 2020-04-24 VITALS — BP 150/97 | HR 127 | Temp 97.7°F | Resp 16 | Ht 69.0 in | Wt 140.8 lb

## 2020-04-24 DIAGNOSIS — I10 Essential (primary) hypertension: Secondary | ICD-10-CM

## 2020-04-24 DIAGNOSIS — R000 Tachycardia, unspecified: Secondary | ICD-10-CM

## 2020-04-24 NOTE — Progress Notes (Signed)
Patient referred by Lin Landsman, MD for tachycardia, hypertension  Subjective:   Samantha Friedman, female    DOB: Aug 18, 1980, 40 y.o.   MRN: [id number]   Chief Complaint  Patient presents with  . Tachycardia  . Follow-up  . Results     HPI  40 y.o. African-American female with h/o provoked DVT and submassive PE (2018), referred for evaluation management of tachycardia and hypertension.  Echocardiogram, lab results reviewed with the patient, details below. Patient has no complaints today. Her home BP log shows HR in 60-70s, with normal blood pressures.   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  She used to be quite active prior to Audiological scientist surgery in 2018.  This was followed by a provoked DVT and acute submassive pulmonary embolism. She underwent EKOS treatment by IR, and was on Eliquis for 6 months thereafter.  Eventually she underwent hypercoagulability work-up at that time.  She was seen by PCP yesterday and was found to have tachycardia in 130s, along with blood pressure in 140s/80s.  Patient denies chest pain, shortness of breath, palpitations, leg edema, orthopnea, PND, TIA/syncope.  She currently walks about 3 miles over 2 hours at a leisurely pace without any symptoms.   Patient was prescribed metoprolol succinate 25 mg by her PCP, but she has not started it yet.    Current Outpatient Medications on File Prior to Visit  Medication Sig Dispense Refill  . Multiple Vitamins-Minerals (MULTIVITAMIN WITH MINERALS) tablet Take 1 tablet by mouth daily.     No current facility-administered medications on file prior to visit.    Cardiovascular and other pertinent studies:  Mobile cardiac telemetry 7 days 03/31/2020 - 04/07/2020: Dominant rhythm: Sinus. HR 47-141 bpm. Avg HR 72 bpm. 1 episode of SVT, at 126 bpm for 5 beats 1.3% isolated SVE, <1 couplet/triplets. No atrial fibrillation/atrial flutter/VT/high grade AV block, sinus pause  >3sec noted. 0 patient triggered events.   .    Echocardiogram 03/31/2020:  Normal LV systolic function with visual EF 60-65%. Left ventricle cavity  is normal in size. Normal global wall motion. Normal diastolic filling  pattern, normal LAP.  Trace regurgitation. No evidence of pulmonary hypertension.  Compared to prior study dated 04/02/2016: no significant change.   EKG 03/20/2020: Sinus tachycardia 132 bpm Left atrial enlargement Diffuse nonspecific ST depression  Echocardiogram 04/02/2016: - Left ventricle: The cavity size was normal. Systolic function was  normal. The estimated ejection fraction was in the range of 55%  to 60%. Wall motion was normal; there were no regional wall  motion abnormalities. Left ventricular diastolic function  parameters were normal.  - Pulmonary arteries: Systolic pressure was mildly increased. PA  peak pressure: 33 mm Hg (S).   CTA PE 03/2016: Positive for acute PE with CT evidence of right heart strain (RV/LV Ratio = 1.6) consistent with at least submassive (intermediate risk) PE. The presence of right heart strain has been associated with an increased risk of morbidity and mortality. Please activate Code PE by paging [phone]. Critical Value/emergent results were called by telephone at the time of interpretation on 03/31/2016 at 6:37 pm to Dr. Marda Stalker , who verbally acknowledged these results.  Peripheral right lower lobe pulmonary opacities consistent with pulmonary infarct as opposed pneumonic consolidations given lack of clinical symptoms for pneumonia after discussion with Dr. Sherry Ruffing.  Cutaneous nodules of the anterior chest wall overlying the lower sternum.    Recent labs: 04/14/2020: D-dimer 0.26 normal  08/15/2019: Glucose  87, BUN/Cr 7/0.91. EGFR 94. Na/K 138/4.5. Rest of the CMP normal H/H 12/39. MCV 97. Platelets 281 Chol 213, TG 59, HDL 79, LDL 119    Review of Systems  Cardiovascular:  Negative for chest pain, dyspnea on exertion, leg swelling, palpitations and syncope.         Vitals:   04/24/20 1437 04/24/20 1444  BP: (!) 149/91 (!) 150/97  Pulse: (!) 114 (!) 127  Resp: 16   Temp: 97.7 F (36.5 C)   SpO2: 100% 100%     Body mass index is 20.79 kg/m. Filed Weights   04/24/20 1437  Weight: 140 lb 12.8 oz (63.9 kg)     Objective:   Physical Exam Vitals and nursing note reviewed.  Constitutional:      General: She is not in acute distress. Neck:     Vascular: No JVD.  Cardiovascular:     Rate and Rhythm: Regular rhythm. Tachycardia present.     Heart sounds: Normal heart sounds. No murmur heard.   Pulmonary:     Effort: Pulmonary effort is normal.     Breath sounds: Normal breath sounds. No wheezing or rales.  Musculoskeletal:     Right lower leg: No edema.     Left lower leg: No edema.         Assessment & Recommendations:   40 y.o. African-American female with h/o provoked DVT and submassive PE (2018), referred for evaluation management of tachycardia and hypertension.  Tachycardia, hypertension: Blood pressure and heart rate normal at home, even without taking metoprolol.  No obvious symptoms to suggest PE.  No chest pain.  No shortness of breath.  Echocardiogram, d-dimer are normal.  I suspect her elevated blood pressure and heart rate are related to white coat hypertension/anxiety only at medical visits. No further workup is necessary at this time.  F/u as needed    Nigel Mormon, MD Pager: [phone] Office: [phone]

## 2020-07-07 ENCOUNTER — Ambulatory Visit: Payer: BC Managed Care – PPO | Admitting: Nurse Practitioner

## 2020-07-14 ENCOUNTER — Other Ambulatory Visit (HOSPITAL_COMMUNITY)
Admission: RE | Admit: 2020-07-14 | Discharge: 2020-07-14 | Disposition: A | Discharge status: Home / Self Care | Payer: BC Managed Care – PPO | Source: Ambulatory Visit | Attending: Nurse Practitioner | Admitting: Nurse Practitioner

## 2020-07-14 ENCOUNTER — Ambulatory Visit (INDEPENDENT_AMBULATORY_CARE_PROVIDER_SITE_OTHER): Payer: BC Managed Care – PPO | Admitting: Nurse Practitioner

## 2020-07-14 ENCOUNTER — Encounter: Payer: Self-pay | Admitting: Nurse Practitioner

## 2020-07-14 ENCOUNTER — Other Ambulatory Visit: Payer: Self-pay

## 2020-07-14 VITALS — BP 136/82 | HR 108 | Ht 70.0 in | Wt 140.0 lb

## 2020-07-14 DIAGNOSIS — Z01419 Encounter for gynecological examination (general) (routine) without abnormal findings: Secondary | ICD-10-CM | POA: Diagnosis not present

## 2020-07-14 DIAGNOSIS — R87810 Cervical high risk human papillomavirus (HPV) DNA test positive: Secondary | ICD-10-CM

## 2020-07-14 NOTE — Progress Notes (Signed)
   Samantha Friedman 08-Sep-1980 [id number]   History:  40 y.o. SBF G0 presents for annual exam without GYN complaints.  Regular monthly cycle.  Was on OCPs in the past but developed a PE in 2018.  Not sexually active.  Not interested in contraception at this time.  Gynecologic History Patient's last menstrual period was 07/03/2020.     Contraception: abstinence Last Pap: 07/04/2019. Results were: normal cytology positive HPV negative 16/18/45  Past medical history, past surgical history, family history and social history were all reviewed and documented in the EPIC chart. Works remotely for [employer].   ROS:  A ROS was performed and pertinent positives and negatives are included.  Exam:  Vitals:   07/14/20 0845  BP: 136/82  Pulse: (!) 108  SpO2: 99%  Weight: 140 lb (63.5 kg)  Height: 5\' 10"  (1.778 m)    Body mass index is 20.09 kg/m.  General appearance:  Normal Thyroid:  Symmetrical, normal in size, without palpable masses or nodularity. Respiratory  Auscultation:  Clear without wheezing or rhonchi Cardiovascular  Auscultation:  Regular rate, without rubs, murmurs or gallops  Edema/varicosities:  Not grossly evident Abdominal  Soft,nontender, without masses, guarding or rebound.  Liver/spleen:  No organomegaly noted  Hernia:  None appreciated  Skin  Inspection:  Grossly normal   Breasts: Examined lying and sitting.   Right: Without masses, retractions, discharge or axillary adenopathy.   Left: Without masses, retractions, discharge or axillary adenopathy. Gentitourinary   Inguinal/mons:  Normal without inguinal adenopathy  External genitalia:  Normal  BUS/Urethra/Skene's glands:  Normal  Vagina:  Normal  Cervix:  Normal  Uterus:  Normal in size, shape and contour.  Midline and mobile  Adnexa/parametria:     Rt: Without masses or tenderness.   Lt: Without masses or tenderness.  Anus and perineum: Normal   Assessment/Plan:  40 y.o. G0 for annual exam.   Well  female exam with routine gynecological exam - Plan: Cytology - PAP( [place]). Education provided on SBEs, importance of preventative screenings, current guidelines, high calcium diet, regular exercise, and multivitamin daily. Labs with PCP (done this morning).   Papanicolaou smear of cervix with positive high risk human papilloma virus (HPV) test - Plan: Cytology - PAP( [place]). 07/04/2019 normal cytology positive HR HPV negative 16/18/45. No other abnormal pap history. Pap with HR HPV today.   Screening for breast cancer - Will start screenings at age 77. Normal breast exam today.  Screening for colon cancer - Will start screenings at age 71.   Follow up in 1 year for annual      [facility], 9:07 AM 07/14/2020

## 2020-07-20 LAB — CYTOLOGY - PAP
Comment: NEGATIVE
Diagnosis: NEGATIVE
High risk HPV: NEGATIVE

## 2021-07-14 NOTE — Progress Notes (Unsigned)
Samantha Friedman 1980-06-29 [id number]   History:  41 y.o. SBF G0 presents for annual exam without GYN complaints.  Monthly cycles. H/O PE in 2018. 2021 pap normal + HR HPV - 16/18/45, normal last year. H/O tachycardia, followed by cardiology.   Gynecologic History Patient's last menstrual period was 07/04/2021 (exact date). Period Duration (Days): 3-5 Period Pattern: Regular Menstrual Flow: Moderate Menstrual Control: Maxi pad Menstrual Control Change Freq (Hours): 12 Dysmenorrhea: (!) Mild Dysmenorrhea Symptoms: Cramping  Contraception/Family planning: abstinence Sexually active: No  Health Maintenance Last Pap: 07/14/2020. Results were: Normal Last mammogram: Not indicated Last colonoscopy: Not indicated Last Dexa: Not indicated  Past medical history, past surgical history, family history and social history were all reviewed and documented in the EPIC chart. Works remotely for [employer].   ROS:  A ROS was performed and pertinent positives and negatives are included.  Exam:  Vitals:   07/15/21 0850 07/15/21 0915  BP: 112/72   Pulse: (!) 120 (!) 113  Resp: 20   SpO2: 98%   Weight: 152 lb 9.6 oz (69.2 kg)   Height: 5' 10.87" (1.8 m)      Body mass index is 21.36 kg/m.  General appearance:  Normal Thyroid:  Symmetrical, normal in size, without palpable masses or nodularity. Respiratory  Auscultation:  Clear without wheezing or rhonchi Cardiovascular  Auscultation:  Regular rate, without rubs, murmurs or gallops  Edema/varicosities:  Not grossly evident Abdominal  Soft,nontender, without masses, guarding or rebound.  Liver/spleen:  No organomegaly noted  Hernia:  None appreciated  Skin  Inspection:  Grossly normal   Breasts: Examined lying and sitting.   Right: Without masses, retractions, discharge or axillary adenopathy.   Left: Without masses, retractions, discharge or axillary adenopathy. Genitourinary   Inguinal/mons:  Normal without inguinal  adenopathy  External genitalia:  Normal appearing vulva with no masses, tenderness, or lesions  BUS/Urethra/Skene's glands:  Normal  Vagina:  Normal appearing with normal color and discharge, no lesions  Cervix:  Normal appearing without discharge or lesions  Uterus:  Normal in size, shape and contour.  Midline and mobile, nontender  Adnexa/parametria:     Rt: Normal in size, without masses or tenderness.   Lt: Normal in size, without masses or tenderness.  Anus and perineum: Normal  Digital rectal exam: Not indicated  Patient informed chaperone available to be present for breast and pelvic exam. Patient has requested no chaperone to be present. Patient has been advised what will be completed during breast and pelvic exam.   Assessment/Plan:  41 y.o. G0 for annual exam.   Well female exam with routine gynecological exam - Education provided on SBEs, importance of preventative screenings, current guidelines, high calcium diet, regular exercise, and multivitamin daily. Labs with PCP.   Papanicolaou smear of cervix with positive high risk human papilloma virus (HPV) test - Plan: Cytology - PAP( [place]). 07/04/2019 normal cytology positive HR HPV negative 16/18/45, normal 07/2020. Pap with HR HPV today per guidelines.   Tachycardia - HR 120 today at rest, recheck 113 at end of visit. Sees cardiology but has not seen since April 2022. Monitors pulse at home and usually runs 70-80s. Denies chest pain, SHOB, or lightheadedness. Continue to monitor and follow up with cardiology.   Screening for breast cancer - Will start screenings at age 36. Normal breast exam today.  Screening for colon cancer - Average risk, will start screenings at age 24.   Follow up in 1 year for annual.  Olivia Mackie [facility], 9:17 AM 07/15/2021

## 2021-07-15 ENCOUNTER — Ambulatory Visit (INDEPENDENT_AMBULATORY_CARE_PROVIDER_SITE_OTHER): Payer: Managed Care, Other (non HMO) | Admitting: Nurse Practitioner

## 2021-07-15 ENCOUNTER — Other Ambulatory Visit (HOSPITAL_COMMUNITY)
Admission: RE | Admit: 2021-07-15 | Discharge: 2021-07-15 | Disposition: A | Discharge status: Home / Self Care | Payer: Managed Care, Other (non HMO) | Source: Ambulatory Visit | Attending: Nurse Practitioner | Admitting: Nurse Practitioner

## 2021-07-15 ENCOUNTER — Encounter: Payer: Self-pay | Admitting: Nurse Practitioner

## 2021-07-15 VITALS — BP 112/72 | HR 113 | Resp 20 | Ht 70.87 in | Wt 152.6 lb

## 2021-07-15 DIAGNOSIS — R87610 Atypical squamous cells of undetermined significance on cytologic smear of cervix (ASC-US): Secondary | ICD-10-CM | POA: Diagnosis present

## 2021-07-15 DIAGNOSIS — R87810 Cervical high risk human papillomavirus (HPV) DNA test positive: Secondary | ICD-10-CM | POA: Diagnosis present

## 2021-07-15 DIAGNOSIS — R000 Tachycardia, unspecified: Secondary | ICD-10-CM

## 2021-07-15 DIAGNOSIS — Z01419 Encounter for gynecological examination (general) (routine) without abnormal findings: Secondary | ICD-10-CM | POA: Diagnosis not present

## 2021-07-16 LAB — CYTOLOGY - PAP
Comment: NEGATIVE
Diagnosis: NEGATIVE
High risk HPV: NEGATIVE

## 2022-02-04 ENCOUNTER — Encounter (HOSPITAL_COMMUNITY): Payer: Self-pay | Admitting: Emergency Medicine

## 2022-02-04 ENCOUNTER — Ambulatory Visit (HOSPITAL_COMMUNITY)
Admission: EM | Admit: 2022-02-04 | Discharge: 2022-02-04 | Disposition: A | Discharge status: Home / Self Care | Payer: PRIVATE HEALTH INSURANCE | Attending: Emergency Medicine | Admitting: Emergency Medicine

## 2022-02-04 DIAGNOSIS — L309 Dermatitis, unspecified: Secondary | ICD-10-CM | POA: Diagnosis not present

## 2022-02-04 MED ORDER — BACITRACIN ZINC 500 UNIT/GM EX OINT: 1.0000 | TOPICAL_OINTMENT | Freq: Two times a day (BID) | CUTANEOUS | 0 refills | Status: AC

## 2022-02-04 NOTE — Discharge Instructions (Addendum)
You appear to have facial dermatitis due to mask wearing.  To help prevent this with your cotton mask please ensure you are washing your mask in either a nonscented detergent like Tide Free and clear.  You can also hand wash your mask with a sensitive skin soap like Dove.  Facial washes and moisturizers from the brand Cerave are generally well-tolerated.  You can also take breaks during the day and rinse your face to help free it from oils.  Please apply the antibiotic ointment 2 times daily to the open area on your chin.  Follow-up with [place] or your PCP as needed.

## 2022-02-04 NOTE — ED Triage Notes (Signed)
Pt c/o rash on chin since Tuesday. Denis itching or pain. Hasn't tired any medications or creams.

## 2022-02-04 NOTE — ED Provider Notes (Signed)
[place]    CSN: [id number] Arrival date & time: 02/04/22  1716      History   Chief Complaint Chief Complaint  Patient presents with   Rash    HPI Samantha Friedman is a 42 y.o. female.   Patient presents with 3 days of rash on her chin.  She denies itching or pain.  She reports she has been wearing her mask more frequently at work due to increase in respiratory illness.  She does wash her mask and again with the rest of her laundry.  She has no other rashes or breakouts.  Denies itching, denies chest pain, denies shortness of breath, denies recent illness, or other complaints.    Rash   Past Medical History:  Diagnosis Date   Anemia    DVT (deep venous thrombosis) ([place]) 2018   Pulmonary embolism ([place]) 2018   Scoliosis    Tachycardia     Patient Active Problem List   Diagnosis Date Noted   Tachycardia 03/20/2020   Essential hypertension 03/20/2020   Status post Achilles tendon repair 07/21/2016   Electrolyte imbalance 04/01/2016   Anemia 04/01/2016   Thrombocytopenia ([place]) 04/01/2016   Acute respiratory failure with hypoxia ([place]) 04/01/2016   Pulmonary embolism ([place]) 03/31/2016   Rupture of left Achilles tendon 01/14/2016    Past Surgical History:  Procedure Laterality Date   ACHILLES TENDON SURGERY Left 01/14/2016   Procedure: DIRECT PRIMARY REPAIR LEFT ACHILLES TENDON;  Surgeon: Mcarthur Rossetti, MD;  Location: [facility];  Service: Orthopedics;  Laterality: Left;   broken thumb      FRACTURE SURGERY     IR GENERIC HISTORICAL  03/31/2016   IR INFUSION THROMBOL ARTERIAL INITIAL (MS) 03/31/2016 Jacqulynn Cadet, MD [facility]   IR GENERIC HISTORICAL  03/31/2016   IR ANGIOGRAM SELECTIVE EACH ADDITIONAL VESSEL 03/31/2016 Jacqulynn Cadet, MD [facility]   IR GENERIC HISTORICAL  03/31/2016   IR ANGIOGRAM SELECTIVE EACH ADDITIONAL VESSEL 03/31/2016 Jacqulynn Cadet, MD [facility]   IR GENERIC HISTORICAL  03/31/2016   IR ANGIOGRAM PULMONARY  BILATERAL SELECTIVE 03/31/2016 Jacqulynn Cadet, MD [facility]   IR GENERIC HISTORICAL  03/31/2016   IR US GUIDE VASC ACCESS RIGHT 03/31/2016 Jacqulynn Cadet, MD [facility]   IR GENERIC HISTORICAL  03/31/2016   IR INFUSION THROMBOL ARTERIAL INITIAL (MS) 03/31/2016 Jacqulynn Cadet, MD [facility]   IR GENERIC HISTORICAL  04/01/2016   IR THROMB F/U EVAL ART/VEN SUBSEQ DAY (MS) 04/01/2016 Arne Cleveland, MD [facility]    OB History     Gravida  0   Para  0   Term  0   Preterm  0   AB  0   Living  0      SAB  0   IAB  0   Ectopic  0   Multiple  0   Live Births  0            Home Medications    Prior to Admission medications   Medication Sig Start Date End Date Taking? Authorizing Provider  bacitracin ointment Apply 1 Application topically 2 (two) times daily. 02/04/22  Yes Louretta Shorten, [place], FNP  Multiple Vitamins-Minerals (MULTIVITAMIN WITH MINERALS) tablet Take 1 tablet by mouth daily.    [provider]    Family History Family History  Problem Relation Age of Onset   Diabetes Father    Stroke Father    Cancer Maternal Grandfather     Social History Social History  Tobacco Use   Smoking status: Never   Smokeless tobacco: Never  Vaping Use   Vaping Use: Never used  Substance Use Topics   Alcohol use: No   Drug use: No     Allergies   Latex   Review of Systems Review of Systems  Constitutional: Negative.   Respiratory: Negative.    Cardiovascular: Negative.   Skin:  Positive for rash.     Physical Exam Triage Vital Signs ED Triage Vitals  Enc Vitals Group     BP 02/04/22 1813 (!) 151/98     Pulse Rate 02/04/22 1813 93     Resp 02/04/22 1813 14     Temp 02/04/22 1813 98.2 F (36.8 C)     Temp Source 02/04/22 1813 Oral     SpO2 02/04/22 1813 98 %     Weight --      Height --      Head Circumference --      Peak Flow --      Pain Score 02/04/22 1811 0     Pain Loc --      Pain Edu? --      Excl. in [place]? --     No data found.  Updated Vital Signs BP (!) 151/98 (BP Location: Right Arm)   Pulse 93   Temp 98.2 F (36.8 C) (Oral)   Resp 14   LMP 01/22/2022   SpO2 98%   Visual Acuity Right Eye Distance:   Left Eye Distance:   Bilateral Distance:    Right Eye Near:   Left Eye Near:    Bilateral Near:     Physical Exam Vitals and nursing note reviewed.  Constitutional:      Appearance: Normal appearance.     Comments: Pleasant 42 year old female who appears stated age.  HENT:     Head: Normocephalic and atraumatic.     Nose: Nose normal.     Mouth/Throat:     Mouth: Mucous membranes are moist.  Cardiovascular:     Rate and Rhythm: Normal rate and regular rhythm.     Heart sounds: Normal heart sounds, S1 normal and S2 normal.  Pulmonary:     Effort: Pulmonary effort is normal.     Breath sounds: Normal breath sounds.     Comments: Lung sounds vesicular posteriorly. Skin:    General: Skin is warm and dry.     Findings: Rash present. Rash is crusting and papular.     Comments: Facial dermatitis to the chin area.  1 lesion noted with crusting.  Nonpruritic.  Neurological:     Mental Status: She is alert.  Psychiatric:        Behavior: Behavior is cooperative.      UC Treatments / Results  Labs (all labs ordered are listed, but only abnormal results are displayed) Labs Reviewed - No data to display  EKG   Radiology No results found.  Procedures Procedures (including critical care time)  Medications Ordered in UC Medications - No data to display  Initial Impression / Assessment and Plan / UC Course  I have reviewed the triage vital signs and the nursing notes.  Pertinent labs & imaging results that were available during my care of the patient were reviewed by me and considered in my medical decision making (see chart for details).  Discussed that this appears consistent with facial dermatitis due to mask wearing.  Area is localized, papular and non-pruritic.   Discussed nonscented soaps, lotions, and detergents.  There is 1 lesion that appears crusted, for this I provided bacitracin ointment to apply twice daily.  Advised to follow-up with PCP as needed.      Final Clinical Impressions(s) / UC Diagnoses   Final diagnoses:  Facial dermatitis     Discharge Instructions      You appear to have facial dermatitis due to mask wearing.  To help prevent this with your cotton mask please ensure you are washing your mask in either a nonscented detergent like Tide Free and clear.  You can also hand wash your mask with a sensitive skin soap like Dove.  Facial washes and moisturizers from the brand Cerave are generally well-tolerated.  You can also take breaks during the day and rinse your face to help free it from oils.  Please apply the antibiotic ointment 2 times daily to the open area on your chin.  Follow-up with [place] or your PCP as needed.     ED Prescriptions     Medication Sig Dispense Auth. Provider   bacitracin ointment Apply 1 Application topically 2 (two) times daily. 120 g Anuel Sitter, [place], [place]      I have reviewed the PDMP during this encounter.   Chrisangel Eskenazi, [place], [place] 02/04/22 [phone]

## 2022-07-20 ENCOUNTER — Ambulatory Visit: Payer: Managed Care, Other (non HMO) | Admitting: Nurse Practitioner
